# Patient Record
Sex: Male | Born: 1935 | Race: Black or African American | Hispanic: No | Marital: Married | Smoking: Former smoker
Health system: Southern US, Community
[De-identification: ages and names within clinical notes are randomized; demographics above are authoritative.]

## PROBLEM LIST (undated history)

## (undated) DIAGNOSIS — I499 Cardiac arrhythmia, unspecified: Secondary | ICD-10-CM

## (undated) DIAGNOSIS — K219 Gastro-esophageal reflux disease without esophagitis: Secondary | ICD-10-CM

## (undated) HISTORY — DX: Cardiac arrhythmia, unspecified: I49.9

## (undated) HISTORY — DX: Gastro-esophageal reflux disease without esophagitis: K21.9

---

## 2008-12-27 ENCOUNTER — Ambulatory Visit: Payer: Self-pay | Admitting: Ophthalmology

## 2009-03-07 ENCOUNTER — Ambulatory Visit: Payer: Self-pay | Admitting: Ophthalmology

## 2010-03-03 HISTORY — PX: FEMORAL ARTERY ANEURYSM REPAIR: SUR1157

## 2011-12-08 ENCOUNTER — Ambulatory Visit: Payer: Self-pay

## 2013-07-08 DIAGNOSIS — I10 Essential (primary) hypertension: Secondary | ICD-10-CM | POA: Insufficient documentation

## 2013-07-08 DIAGNOSIS — Z86718 Personal history of other venous thrombosis and embolism: Secondary | ICD-10-CM | POA: Insufficient documentation

## 2013-07-08 DIAGNOSIS — E7800 Pure hypercholesterolemia, unspecified: Secondary | ICD-10-CM | POA: Insufficient documentation

## 2013-07-08 DIAGNOSIS — D649 Anemia, unspecified: Secondary | ICD-10-CM | POA: Insufficient documentation

## 2013-07-08 DIAGNOSIS — I82409 Acute embolism and thrombosis of unspecified deep veins of unspecified lower extremity: Secondary | ICD-10-CM | POA: Insufficient documentation

## 2015-04-03 DIAGNOSIS — R4182 Altered mental status, unspecified: Secondary | ICD-10-CM | POA: Insufficient documentation

## 2015-04-03 DIAGNOSIS — D61818 Other pancytopenia: Secondary | ICD-10-CM | POA: Insufficient documentation

## 2015-04-05 DIAGNOSIS — F0390 Unspecified dementia without behavioral disturbance: Secondary | ICD-10-CM | POA: Insufficient documentation

## 2015-04-09 ENCOUNTER — Encounter: Payer: Self-pay | Admitting: Family Medicine

## 2015-04-17 ENCOUNTER — Encounter: Payer: Self-pay | Admitting: Urology

## 2015-04-17 ENCOUNTER — Ambulatory Visit (INDEPENDENT_AMBULATORY_CARE_PROVIDER_SITE_OTHER): Payer: Medicare Other | Admitting: Urology

## 2015-04-17 VITALS — BP 156/96 | HR 56 | Ht 72.0 in | Wt 153.6 lb

## 2015-04-17 DIAGNOSIS — Z125 Encounter for screening for malignant neoplasm of prostate: Secondary | ICD-10-CM | POA: Diagnosis not present

## 2015-04-17 DIAGNOSIS — R3129 Other microscopic hematuria: Secondary | ICD-10-CM | POA: Diagnosis not present

## 2015-04-17 DIAGNOSIS — N189 Chronic kidney disease, unspecified: Secondary | ICD-10-CM

## 2015-04-17 DIAGNOSIS — N2889 Other specified disorders of kidney and ureter: Secondary | ICD-10-CM | POA: Insufficient documentation

## 2015-04-17 DIAGNOSIS — N182 Chronic kidney disease, stage 2 (mild): Secondary | ICD-10-CM | POA: Insufficient documentation

## 2015-04-17 LAB — URINALYSIS, COMPLETE
BILIRUBIN UA: NEGATIVE
GLUCOSE, UA: NEGATIVE
Leukocytes, UA: NEGATIVE
NITRITE UA: NEGATIVE
Specific Gravity, UA: 1.025 (ref 1.005–1.030)
UUROB: 1 mg/dL (ref 0.2–1.0)
pH, UA: 5.5 (ref 5.0–7.5)

## 2015-04-17 LAB — MICROSCOPIC EXAMINATION

## 2015-04-17 NOTE — Progress Notes (Signed)
04/17/2015 1:51 PM   Donald Murphy Jul 06, 1922 [id number]  Referring provider: No referring provider defined for this encounter.  Chief Complaint  Patient presents with  . New Patient (Initial Visit)    microscopic hematuria    HPI:  1 - Microscopic Hematuria - pt with blood on UA >10RBC/hpf x several. Non-smoker. Does have remote h/o TB (lung only per history) treated in 1970s.  2 - Prostate Screening - No FHX prostate cancer. DRE 04/2015 50gm Rt nodular / PSA pending. Undergoing "for cause" eval 2017 given hematuria above.  3 - Mild Renal Insuficiency - Cr 1.6 / GFR 50 x years. NO recent GU imaging.  PMH sig for mild dementia, PAD/plavix. He still lives independently and is remarkably spry for 82yo. His PCP is Dr. Vira [facility].   Today "Donald Murphy" is seen as new patient for above.   PMH: Past Medical History  Diagnosis Date  . Acid reflux   . Arrhythmia     Surgical History: Past Surgical History  Procedure Laterality Date  . Femoral artery aneurysm repair  2012    Home Medications:    Medication List       This list is accurate as of: 04/17/15  1:51 PM.  Always use your most recent med list.               amLODipine 2.5 MG tablet  Commonly known as:  NORVASC  Take by mouth.     clopidogrel 75 MG tablet  Commonly known as:  PLAVIX  Take by mouth.     donepezil 5 MG tablet  Commonly known as:  ARICEPT  Take by mouth.     lisinopril 20 MG tablet  Commonly known as:  PRINIVIL,ZESTRIL  Take by mouth.     MULTI-VITAMINS Tabs  Take by mouth.     pravastatin 40 MG tablet  Commonly known as:  PRAVACHOL  Take by mouth.     RA VITAMIN B-12 TR 1000 MCG Tbcr  Generic drug:  Cyanocobalamin  Take by mouth.        Allergies:  Allergies  Allergen Reactions  . Lisinopril-Hydrochlorothiazide Other (See Comments)    Renal insufficiency     Family History: No family history on file.  Social History:  has no tobacco, alcohol, and drug  history on file.  ROS: UROLOGY Frequent Urination?: Yes Hard to postpone urination?: Yes Burning/pain with urination?: No Get up at night to urinate?: Yes Leakage of urine?: No Urine stream starts and stops?: Yes Trouble starting stream?: No Do you have to strain to urinate?: No Blood in urine?: Yes Urinary tract infection?: No Sexually transmitted disease?: No Injury to kidneys or bladder?: No Painful intercourse?: No Weak stream?: No Erection problems?: Yes Penile pain?: No  Gastrointestinal Nausea?: No Vomiting?: No Indigestion/heartburn?: Yes Diarrhea?: No Constipation?: No  Constitutional Fever: No Night sweats?: No Weight loss?: Yes Fatigue?: Yes  Skin Skin rash/lesions?: Yes Itching?: No  Eyes Blurred vision?: No Double vision?: No  Ears/Nose/Throat Sore throat?: No Sinus problems?: No  Hematologic/Lymphatic Swollen glands?: No Easy bruising?: No  Cardiovascular Leg swelling?: Yes Chest pain?: No  Respiratory Cough?: No Shortness of breath?: Yes  Endocrine Excessive thirst?: No  Musculoskeletal Back pain?: Yes Joint pain?: Yes  Neurological Headaches?: No Dizziness?: No  Psychologic Depression?: No Anxiety?: No  Physical Exam: BP 156/96 mmHg  Pulse 56  Ht 6' (1.829 m)  Wt 153 lb 9.6 oz (69.673 kg)  BMI 20.83 kg/m2  Constitutional:  Alert and oriented, No acute distress. HEENT: [place] AT, moist mucus membranes.  Trachea midline, no masses. Cardiovascular: No clubbing, cyanosis, or edema. Respiratory: Normal respiratory effort, no increased work of breathing. GI: Abdomen is soft, nontender, nondistended, no abdominal masses GU: No CVA tenderness. DRE Rt side nodular. Skin: No rashes, bruises or suspicious lesions. Lymph: No cervical or inguinal adenopathy. Neurologic: Grossly intact, no focal deficits, moving all 4 extremities. Psychiatric: Normal mood and affect.  Laboratory Data: No results found for: WBC, HGB, HCT, MCV,  PLT  No results found for: CREATININE  No results found for: PSA  No results found for: TESTOSTERONE  No results found for: HGBA1C  Urinalysis No results found for: COLORURINE, APPEARANCEUR, LABSPEC, PHURINE, GLUCOSEU, HGBUR, BILIRUBINUR, KETONESUR, PROTEINUR, UROBILINOGEN, NITRITE, LEUKOCYTESUR  Pertinent Imaging: none  Assessment & Plan:    1 - Microscopic Hematuria - We discussed the potential causes of hematuria ranging from benign etiologies (medical renal disease, urolithiasis, AV malformation, other GU malformation) to malignancy (cancers of the kidney, ureter, bladder, prostate in men). We discussed that current use of blood thinners may exacerbate or unmask the condition. We then outlined the well-accepted guideline-advocated workup including labs, axial imaging and cystoscopy. We mentioned that in up to 30% of cases no identifiable etiology is found, in which case we advocate yearly urinalysis + repeat evaluation for new episodes of gross blood.  The patient wishes to proceed with work-up.   BMP  2 - Prostate Screening - PSA today. HIs exam is concerning.   3 - Mild Renal Insuficiency - Imaging on return to verify no hydro / obsstruction  4 - RTC 3-4 weeks for CT and cysto  CC:  Dr. [facility].   No Follow-up on file.  Sebastian Ache, MD  [facility] [address] [place], [place] [address] [phone]

## 2015-04-18 LAB — BASIC METABOLIC PANEL
BUN / CREAT RATIO: 10 (ref 10–22)
BUN: 12 mg/dL (ref 10–36)
CO2: 19 mmol/L (ref 18–29)
CREATININE: 1.24 mg/dL (ref 0.76–1.27)
Calcium: 9.7 mg/dL (ref 8.6–10.2)
Chloride: 103 mmol/L (ref 96–106)
GFR, EST AFRICAN AMERICAN: 59 mL/min/{1.73_m2} — AB (ref >59)
GFR, EST NON AFRICAN AMERICAN: 51 mL/min/{1.73_m2} — AB (ref >59)
GLUCOSE: 92 mg/dL (ref 65–99)
Potassium: 4.8 mmol/L (ref 3.5–5.2)
SODIUM: 140 mmol/L (ref 134–144)

## 2015-04-18 LAB — PSA: Prostate Specific Ag, Serum: 4.6 ng/mL — ABNORMAL HIGH (ref 0.0–4.0)

## 2015-05-07 ENCOUNTER — Ambulatory Visit: Admission: RE | Admit: 2015-05-07 | Payer: Medicare Other | Source: Ambulatory Visit

## 2015-05-10 ENCOUNTER — Other Ambulatory Visit: Payer: Medicare Other | Admitting: Urology

## 2015-05-10 ENCOUNTER — Encounter: Payer: Self-pay | Admitting: Urology

## 2015-06-01 ENCOUNTER — Ambulatory Visit
Admission: RE | Admit: 2015-06-01 | Discharge: 2015-06-01 | Disposition: A | Discharge status: Home / Self Care | Payer: Medicare Other | Source: Ambulatory Visit | Attending: Urology | Admitting: Urology

## 2015-06-01 DIAGNOSIS — N182 Chronic kidney disease, stage 2 (mild): Secondary | ICD-10-CM

## 2015-06-01 DIAGNOSIS — R3129 Other microscopic hematuria: Secondary | ICD-10-CM | POA: Insufficient documentation

## 2015-06-01 DIAGNOSIS — N189 Chronic kidney disease, unspecified: Secondary | ICD-10-CM | POA: Diagnosis present

## 2015-06-01 LAB — POCT I-STAT CREATININE: Creatinine, Ser: 1.4 mg/dL — ABNORMAL HIGH (ref 0.61–1.24)

## 2015-06-01 MED ORDER — IOPAMIDOL (ISOVUE-300) INJECTION 61%
100.0000 mL | Freq: Once | INTRAVENOUS | Status: AC | PRN
  Administered 2015-06-01: 100 mL via INTRAVENOUS

## 2015-06-05 ENCOUNTER — Ambulatory Visit (INDEPENDENT_AMBULATORY_CARE_PROVIDER_SITE_OTHER): Payer: Medicare Other | Admitting: Urology

## 2015-06-05 VITALS — BP 149/78 | HR 60 | Ht 72.0 in | Wt 146.0 lb

## 2015-06-05 DIAGNOSIS — R3129 Other microscopic hematuria: Secondary | ICD-10-CM | POA: Diagnosis not present

## 2015-06-05 DIAGNOSIS — Q6100 Congenital renal cyst, unspecified: Secondary | ICD-10-CM

## 2015-06-05 DIAGNOSIS — N182 Chronic kidney disease, stage 2 (mild): Secondary | ICD-10-CM

## 2015-06-05 DIAGNOSIS — N189 Chronic kidney disease, unspecified: Secondary | ICD-10-CM | POA: Diagnosis not present

## 2015-06-05 DIAGNOSIS — Z125 Encounter for screening for malignant neoplasm of prostate: Secondary | ICD-10-CM

## 2015-06-05 DIAGNOSIS — N281 Cyst of kidney, acquired: Secondary | ICD-10-CM

## 2015-06-05 LAB — URINALYSIS, COMPLETE
BILIRUBIN UA: NEGATIVE
Glucose, UA: NEGATIVE
Ketones, UA: NEGATIVE
LEUKOCYTES UA: NEGATIVE
NITRITE UA: NEGATIVE
PH UA: 6 (ref 5.0–7.5)
Protein, UA: NEGATIVE
RBC UA: NEGATIVE
Specific Gravity, UA: 1.015 (ref 1.005–1.030)
Urobilinogen, Ur: 0.2 mg/dL (ref 0.2–1.0)

## 2015-06-05 LAB — MICROSCOPIC EXAMINATION
BACTERIA UA: NONE SEEN
EPITHELIAL CELLS (NON RENAL): NONE SEEN /HPF (ref 0–10)
WBC UA: NONE SEEN /HPF (ref 0–?)

## 2015-06-05 MED ORDER — LIDOCAINE HCL 2 % EX GEL
1.0000 "application " | Freq: Once | CUTANEOUS | Status: AC
  Administered 2015-06-05: 1 via URETHRAL

## 2015-06-05 MED ORDER — CIPROFLOXACIN HCL 500 MG PO TABS
500.0000 mg | ORAL_TABLET | Freq: Once | ORAL | Status: AC
  Administered 2015-06-05: 500 mg via ORAL

## 2015-06-05 NOTE — Progress Notes (Signed)
10:01 AM   Donald Murphy 29-Aug-1922 [id number]  Referring provider: Sula Rumpleharanjit Virk, MD [address] [place], [place] [id number]  Chief Complaint  Patient presents with  . Cysto    CTscan results    HPI:  1 - Microscopic Hematuria - pt with blood on UA >10RBC/hpf x several. Non-smoker. Does have remote h/o TB (lung only per history) treated in 1970s. CT urogram 05/2015 w/o worrisome masses/ stones. Cysto 06/2015 with some assymetric prostattic hypertrophy but now urothelial lesions.   2 - Prostate Screening - No FHX prostate cancer. DRE 04/2015 50gm PSA 4.6 at age 82 (normal age adjusted) Undergoing "for cause" eval 2017 given hematuria above.  3 - Mild Renal Insuficiency - Cr 1.6 / GFR 50 x years. CT 2017 w/o hydro.   4 - Bilateral Non-Complex Renal Cysts - incidental cysts w/o enhancement / nodules by hematuria CT 2017. Largest lef upper 5.5 cm but no mass effect.   PMH sig for mild dementia, PAD/plavix. He still lives independently and is remarkably spry for 82yo. His PCP is Dr. Vira [facility]anco. [facility]   Today "Donald MaoWillard" is seen for cysto and f/u above.   PMH: Past Medical History  Diagnosis Date  . Acid reflux   . Arrhythmia     Surgical History: Past Surgical History  Procedure Laterality Date  . Femoral artery aneurysm repair  2012    Home Medications:    Medication List       This list is accurate as of: 06/05/15 10:01 AM.  Always use your most recent med list.               amLODipine 2.5 MG tablet  Commonly known as:  NORVASC  Take by mouth.     clopidogrel 75 MG tablet  Commonly known as:  PLAVIX  Take by mouth.     donepezil 5 MG tablet  Commonly known as:  ARICEPT  Take by mouth.     lisinopril 20 MG tablet  Commonly known as:  PRINIVIL,ZESTRIL  Take by mouth.     MULTI-VITAMINS Tabs  Take by mouth.     pravastatin 40 MG tablet  Commonly known as:  PRAVACHOL  Take by mouth.     RA VITAMIN B-12 TR 1000 MCG Tbcr  Generic drug:   Cyanocobalamin  Take by mouth.        Allergies:  Allergies  Allergen Reactions  . Lisinopril-Hydrochlorothiazide Other (See Comments)    Renal insufficiency     Family History: No family history on file.  Social History:  reports that he quit smoking about 40 years ago. His smoking use included Cigars. He does not have any smokeless tobacco history on file. He reports that he does not drink alcohol or use illicit drugs.  ROS: No new HEENT, GI, CV, GU, Endocrine, Lymphatic compliants   Physical Exam: BP 149/78 mmHg  Pulse 60  Ht 6' (1.829 m)  Wt 146 lb (66.225 kg)  BMI 19.80 kg/m2  Constitutional:  Alert and oriented, No acute distress. HEENT: [place] AT, moist mucus membranes.  Trachea midline, no masses. Cardiovascular: No clubbing, cyanosis, or edema. Respiratory: Normal respiratory effort, no increased work of breathing. GI: Abdomen is soft, nontender, nondistended, no abdominal masses GU: No CVA tenderness. DRE Rt side nodular. Skin: No rashes, bruises or suspicious lesions. Lymph: No cervical or inguinal adenopathy. Neurologic: Grossly intact, no focal deficits, moving all 4 extremities. Psychiatric: Normal mood and affect.  Laboratory Data: No results found  for: WBC, HGB, HCT, MCV, PLT  Lab Results  Component Value Date   CREATININE 1.40* 06/01/2015    No results found for: PSA  No results found for: TESTOSTERONE  No results found for: HGBA1C  Urinalysis    Component Value Date/Time   APPEARANCEUR Clear 04/17/2015 1354   GLUCOSEU Negative 04/17/2015 1354   BILIRUBINUR Negative 04/17/2015 1354   PROTEINUR 1+* 04/17/2015 1354   NITRITE Negative 04/17/2015 1354   LEUKOCYTESUR Negative 04/17/2015 1354       Cystoscopy Procedure Note  Patient identification was confirmed, informed consent was obtained, and patient was prepped using Betadine solution.  Lidocaine jelly was administered per urethral meatus.    Preoperative abx where received prior to  procedure.     Pre-Procedure: - Inspection reveals a normal caliber ureteral meatus.  Procedure: The flexible cystoscope was introduced without difficulty - No urethral strictures/lesions are present. - Enlarged prostate with Lt>Rt lobe ypertrophy.  - Normal bladder neck - Bilateral ureteral orifices identified - Bladder mucosa  reveals no ulcers, tumors, or lesions - No bladder stones - Mild trabeculation  Retroflexion shows no additional findings.    Post-Procedure: - Patient tolerated the procedure well    Pertinent Imaging: none  Assessment & Plan:    1 - Microscopic Hematuria - eval with labs, exam, imaging, cysto with likely BPH in setting of plavix as likely cause. Would consider daily finasteride if gross hematuria of bother from obsctruive voiding develops.   2 - Prostate Screening - PSA normgal for age and no severe intraluminal obsruction by cysto. No role for further screening.    3 - Mild Renal Insuficiency - likely medical renal disease, no hydro on imaging.  4 - Bilateral Non-Complex Renal Cysts - <1% chance malignancy, and no mass effect. No further evaluation or surveillance warranted.  5 - RTC 1 year with NP to verify no interval gross hematuria, then prn.    CC:  [facility]. [facility].   No Follow-up on file.  Sebastian Ache, MD  [facility] [address], [address] [place], [place] [address] [phone]

## 2016-06-04 ENCOUNTER — Ambulatory Visit: Payer: Medicare Other

## 2016-06-23 ENCOUNTER — Encounter: Payer: Self-pay | Admitting: Urology

## 2016-06-23 ENCOUNTER — Ambulatory Visit (INDEPENDENT_AMBULATORY_CARE_PROVIDER_SITE_OTHER): Payer: Medicare Other | Admitting: Urology

## 2016-06-23 VITALS — BP 105/72 | HR 112 | Ht 72.0 in | Wt 150.0 lb

## 2016-06-23 DIAGNOSIS — R3129 Other microscopic hematuria: Secondary | ICD-10-CM | POA: Diagnosis not present

## 2016-06-23 NOTE — Progress Notes (Signed)
06/23/2016 9:31 AM   Donald Murphy 13-Jan-1923 [id number]  Referring provider: Sula Rumple, MD [address] [place], [place] [address]  Chief Complaint  Patient presents with  . Hematuria    HPI: 1 - Microscopic Hematuria - pt with blood on UA >10RBC/hpf x several. Non-smoker. Does have remote h/o TB (lung only per history) treated in 1970s. CT urogram 05/2015 w/o worrisome masses/ stones. Cysto 06/2015 with some assymetric prostattic hypertrophy but no urothelial lesions (normal).   2 - Prostate Screening - No FHX prostate cancer. DRE 04/2015 50gm PSA 4.6 at age 30 (normal age adjusted) Undergoing "for cause" eval 2017 given hematuria above.  3 - Mild Renal Insuficiency - Cr 1.6 / GFR 50 x years. CT 2017 w/o hydro. Jan 2018 bun 24 and Cr 1.6.   4 - Bilateral Non-Complex Renal Cysts - incidental cysts w/o enhancement / nodules by hematuria CT 2017. Largest lef upper 5.5 cm but no mass effect.   PMH sig for mild dementia, PAD/plavix. He still lives independently and is remarkably spry for 83yo. His PCP is Dr. [facility]a [facility].     Today, Donald Murphy is seen for the above. He has been well. No LUTS. No gross hematuria. No flank pain or stone passage. He cannot leave a specimen today. He said he voided just before they called him back.     PMH: Past Medical History:  Diagnosis Date  . Acid reflux   . Arrhythmia     Surgical History: Past Surgical History:  Procedure Laterality Date  . FEMORAL ARTERY ANEURYSM REPAIR  2012    Home Medications:  Allergies as of 06/23/2016      Reactions   Lisinopril-hydrochlorothiazide Other (See Comments)   Renal insufficiency       Medication List       Accurate as of 06/23/16  9:31 AM. Always use your most recent med list.          amLODipine 2.5 MG tablet Commonly known as:  NORVASC Take by mouth.   clopidogrel 75 MG tablet Commonly known as:  PLAVIX Take by mouth.   donepezil 5 MG tablet Commonly known as:   ARICEPT Take by mouth.   lisinopril 20 MG tablet Commonly known as:  PRINIVIL,ZESTRIL Take by mouth.   multivitamin tablet Take 1 tablet by mouth daily.   pravastatin 40 MG tablet Commonly known as:  PRAVACHOL Take by mouth.   VITAMIN B 12 PO Take by mouth.       Allergies:  Allergies  Allergen Reactions  . Lisinopril-Hydrochlorothiazide Other (See Comments)    Renal insufficiency     Family History: Family History  Problem Relation Age of Onset  . Prostate cancer Neg Hx   . Bladder Cancer Neg Hx   . Kidney cancer Neg Hx     Social History:  reports that he quit smoking about 41 years ago. His smoking use included Cigars. He has never used smokeless tobacco. He reports that he does not drink alcohol or use drugs.  ROS: UROLOGY Frequent Urination?: No Hard to postpone urination?: No Burning/pain with urination?: No Get up at night to urinate?: No Leakage of urine?: No Urine stream starts and stops?: No Trouble starting stream?: No Do you have to strain to urinate?: No Blood in urine?: No Urinary tract infection?: No Sexually transmitted disease?: No Injury to kidneys or bladder?: No Painful intercourse?: No Weak stream?: No Erection problems?: No Penile pain?: No  Gastrointestinal Nausea?: No Vomiting?:  No Indigestion/heartburn?: No Diarrhea?: No Constipation?: No  Constitutional Fever: No Night sweats?: No Weight loss?: No Fatigue?: No  Skin Skin rash/lesions?: No Itching?: No  Eyes Blurred vision?: No Double vision?: No  Ears/Nose/Throat Sore throat?: No Sinus problems?: No  Hematologic/Lymphatic Swollen glands?: No Easy bruising?: No  Cardiovascular Leg swelling?: No Chest pain?: No  Respiratory Cough?: No Shortness of breath?: No  Endocrine Excessive thirst?: No  Musculoskeletal Back pain?: No Joint pain?: No  Neurological Headaches?: No Dizziness?: No  Psychologic Depression?: No Anxiety?: No  Physical  Exam: BP 105/72 (BP Location: Left Arm, Patient Position: Sitting, Cuff Size: Normal)   Pulse (!) 112   Ht 6' (1.829 m)   Wt 68 kg (150 lb)   BMI 20.34 kg/m   Constitutional:  Alert and oriented, No acute distress. HEENT: [place] AT, moist mucus membranes.  Trachea midline, no masses. Cardiovascular: No clubbing, cyanosis, or edema. Respiratory: Normal respiratory effort, no increased work of breathing. GI: Abdomen is soft, nontender, nondistended, no abdominal masses GU: No CVA tenderness.  Penis - foreskin normal, uncircumcised, penis normal  Scrotum - normal  DRE - prostate 30-40 g, smooth, all landmarks preserved Skin: No rashes, bruises or suspicious lesions. Lymph: No cervical or inguinal adenopathy. Neurologic: Grossly intact, no focal deficits, moving all 4 extremities. Psychiatric: Normal mood and affect.  Laboratory Data: No results found for: WBC, HGB, HCT, MCV, PLT  Lab Results  Component Value Date   CREATININE 1.40 (H) 06/01/2015    No results found for: PSA  No results found for: TESTOSTERONE  No results found for: HGBA1C  Urinalysis    Component Value Date/Time   APPEARANCEUR Clear 06/05/2015 0940   GLUCOSEU Negative 06/05/2015 0940   BILIRUBINUR Negative 06/05/2015 0940   PROTEINUR Negative 06/05/2015 0940   NITRITE Negative 06/05/2015 0940   LEUKOCYTESUR Negative 06/05/2015 0940    Pertinent Imaging: CT   Assessment & Plan:    1. Microscopic hematuria - no worrisome symptoms. Normal exam today. Monitor for gross hematuria, flank pain, urinary symptoms, etc. Check in 1 year and then PRN if stable.       No Follow-up on file.  Jerilee Field, MD  [facility] [address] [place], [place] [address] [phone]

## 2016-09-05 IMAGING — CT CT ABD-PEL WO/W CM
2 of 4 series · 13 of 32 positions shown, 18 images · IV contrast (iopamidol)
Comparison: None.

CLINICAL DATA: Microscopic hematuria, end-stage renal disease, AAA
repair

EXAM:
CT ABDOMEN AND PELVIS WITHOUT AND WITH CONTRAST
TECHNIQUE: Multidetector CT imaging of the abdomen and pelvis was performed
following the standard protocol before and following the bolus
administration of intravenous contrast.
CONTRAST:  100mL EMHSK6-M88 IOPAMIDOL (EMHSK6-M88) INJECTION 61%

[Series 2: hematuria > 45 wo · axial · 0.67mm/px · z∈[-1138,-813]mm · 8 of 85 slices shown, 13 images]
[im 10/85  soft-tissue]
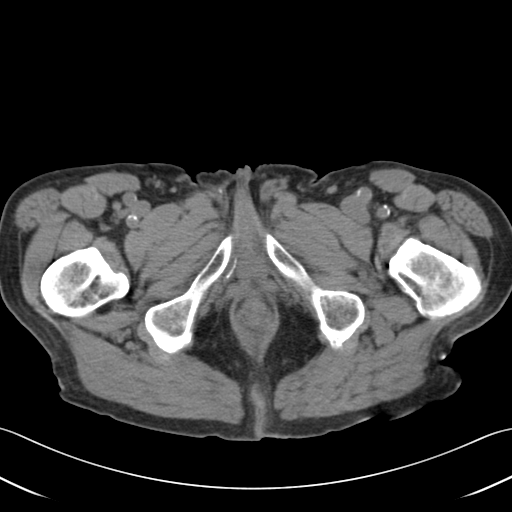
[im 10/85  bone]
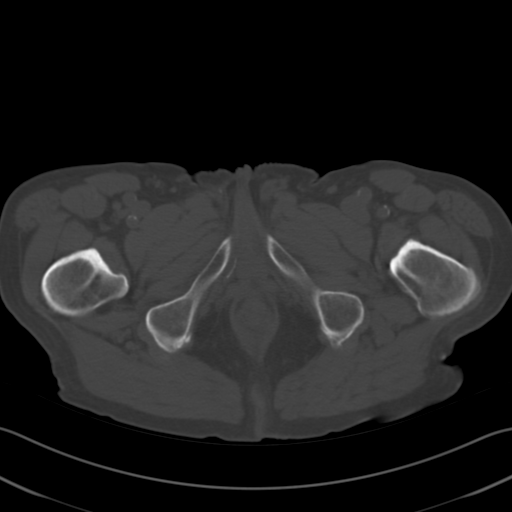
[im 19/85  soft-tissue]
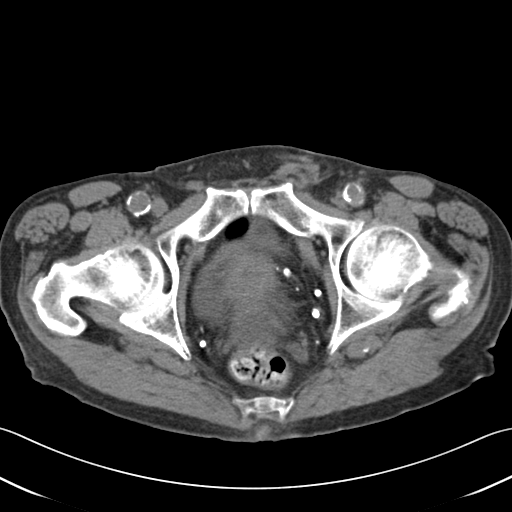
[im 29/85  soft-tissue]
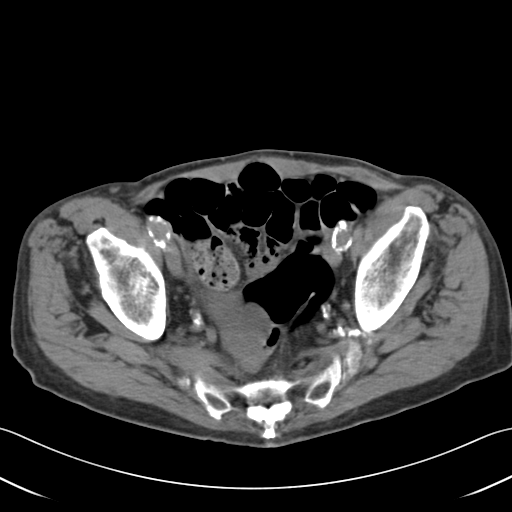
[im 38/85  soft-tissue]
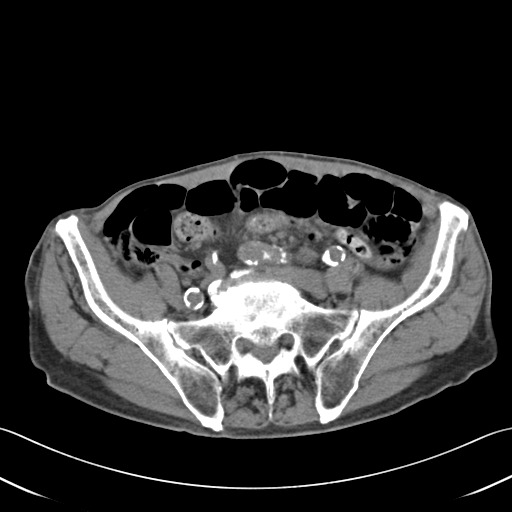
[im 47/85  soft-tissue]
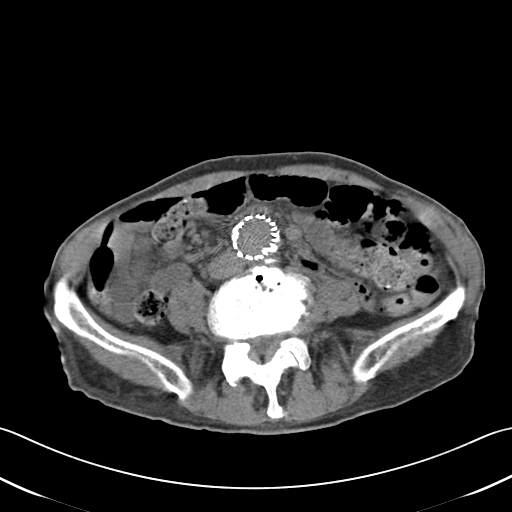
[im 47/85  lung]
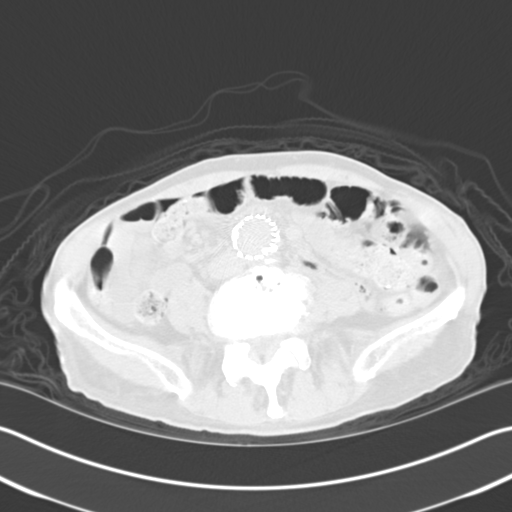
[im 57/85  soft-tissue]
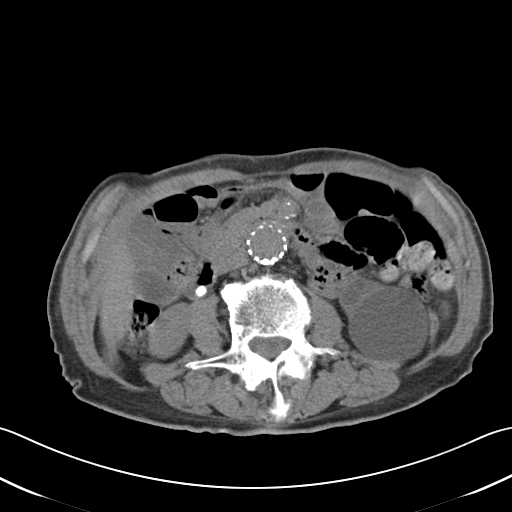
[im 57/85  lung]
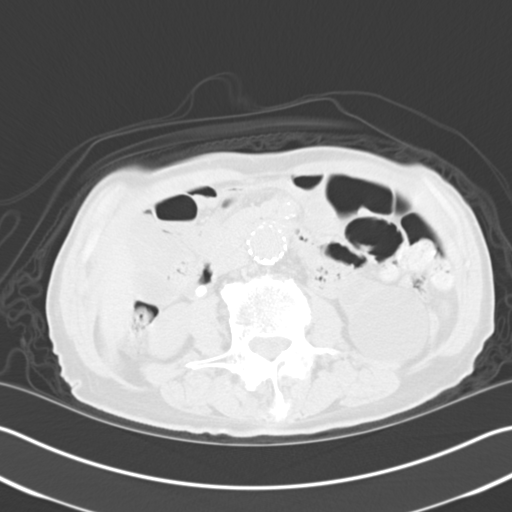
[im 66/85  soft-tissue]
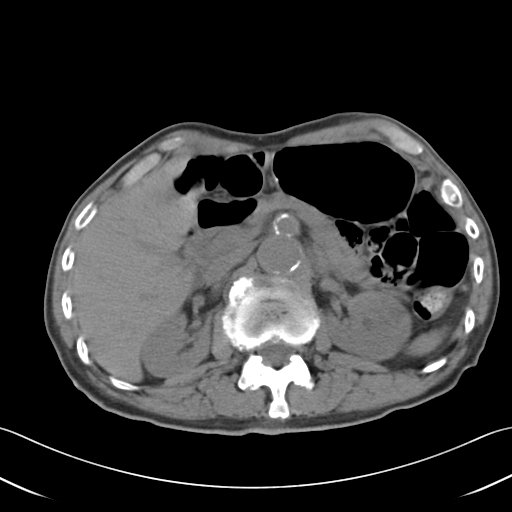
[im 66/85  lung]
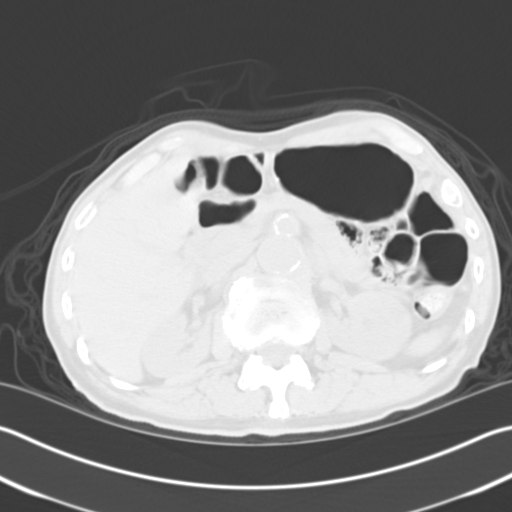
[im 75/85  soft-tissue]
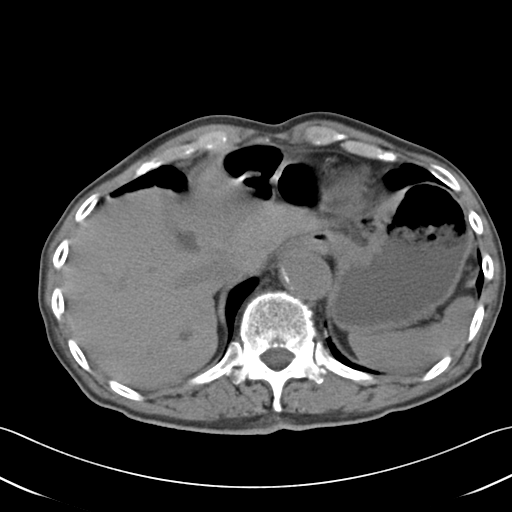
[im 75/85  lung]
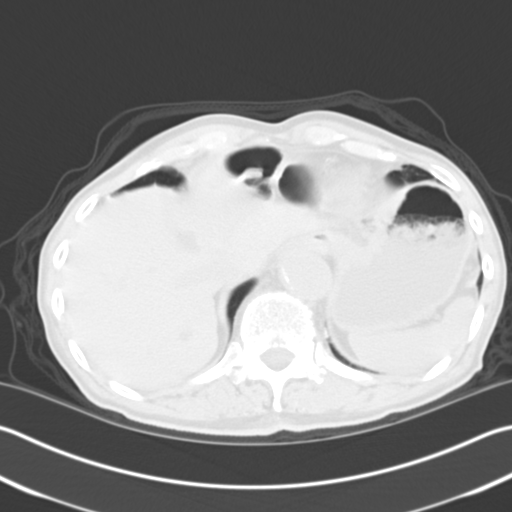

[Series 12: hematuria > 45 delay · axial · delayed · 0.68mm/px · z∈[-1138,-953]mm · 5 of 85 slices shown]
[im 10/85  soft-tissue]
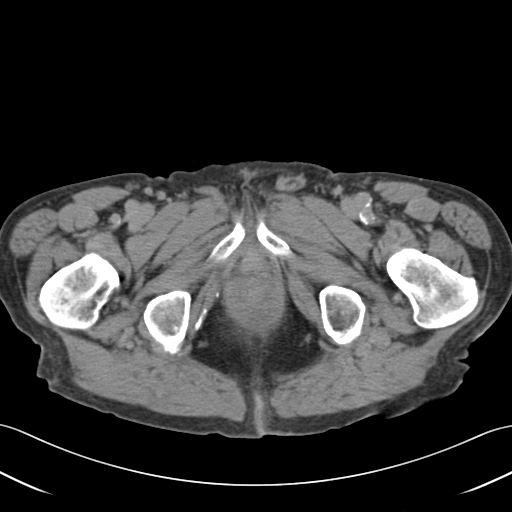
[im 19/85  soft-tissue]
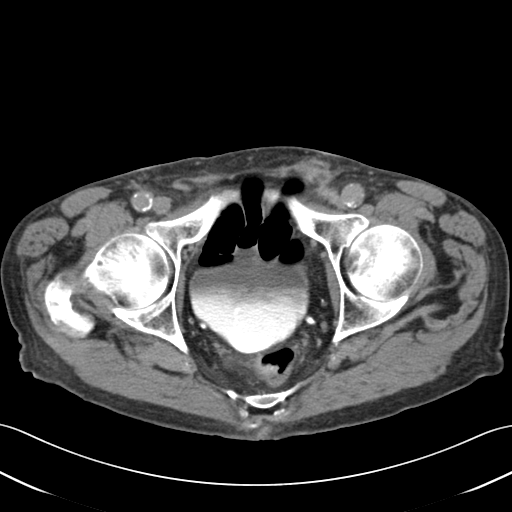
[im 29/85  soft-tissue]
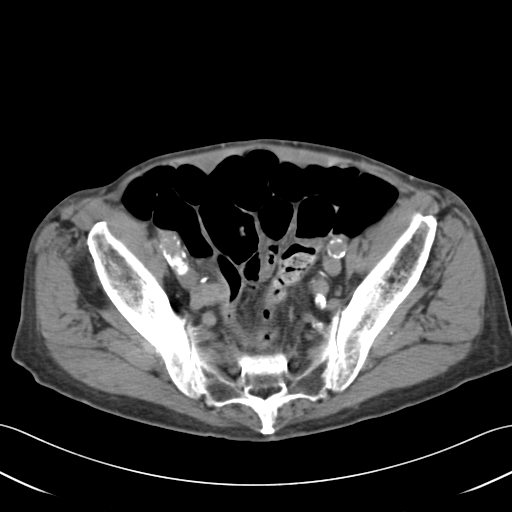
[im 38/85  soft-tissue]
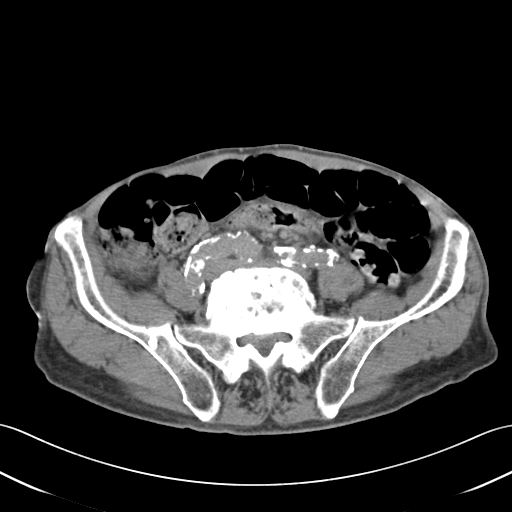
[im 47/85  soft-tissue]
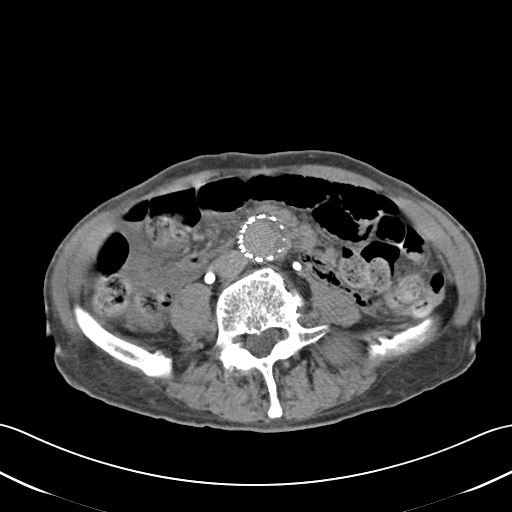

[13 of 32 positions shown; findings below may reference images not displayed]

FINDINGS: Lower chest: Mild patchy opacity with high density at the right lung
base (series 4/ image 13), likely atelectasis/ scarring, possibly
related to prior aspiration.

Hepatobiliary: Scattered hepatic cysts measuring up to 2.0 cm
(series 17/ image 14).

Gallbladder is poorly evaluated but grossly unremarkable. No
intrahepatic or extrahepatic ductal dilatation.

Pancreas: Parenchymal atrophy. Mild ductal prominence, without
associated mass.

Spleen: Within normal limits.

Adrenals/Urinary Tract: Adrenal glands are within normal limits.

Right kidney is grossly unremarkable.

Multiple left renal cysts, measuring up to 5.5 cm in the lateral
left lower pole (series 17/ image 31). Mildly complex/septated
cm medial left lower pole renal cyst (series 17/ image 33), without
enhancement, benign (Bosniak II).

Left renal vascular calcification (series 2/ image 22). No renal,
ureteral, or bladder calculi. No hydronephrosis.

Bladder is within normal limits.

Stomach/Bowel: Stomach is within normal limits.

Visualized bowel is unremarkable.

Normal appendix (series 17/ image 53).

Vascular/Lymphatic: Atherosclerotic calcifications the abdominal
aorta and branch vessels. AAA stent.

No suspicious abdominopelvic lymphadenopathy.

Reproductive: Prostate is grossly unremarkable.

Other: Small volume abdominopelvic ascites.

Postsurgical changes related to left inguinal hernia repair.

Musculoskeletal: Degenerative changes of the visualized
thoracolumbar spine.
IMPRESSION: Multiple left renal cysts, measuring up to 5.5 cm, benign (Bosniak
I-II). No enhancing renal lesions.

No renal, ureteral, or bladder calculi.  No hydronephrosis.

Additional ancillary findings as above.

## 2016-10-13 DIAGNOSIS — I714 Abdominal aortic aneurysm, without rupture, unspecified: Secondary | ICD-10-CM | POA: Insufficient documentation

## 2016-10-17 DIAGNOSIS — D696 Thrombocytopenia, unspecified: Secondary | ICD-10-CM | POA: Insufficient documentation

## 2017-06-23 ENCOUNTER — Ambulatory Visit: Payer: Medicare Other | Admitting: Urology

## 2017-07-09 ENCOUNTER — Other Ambulatory Visit: Payer: Self-pay

## 2017-07-09 DIAGNOSIS — R3129 Other microscopic hematuria: Secondary | ICD-10-CM

## 2017-07-10 ENCOUNTER — Ambulatory Visit (INDEPENDENT_AMBULATORY_CARE_PROVIDER_SITE_OTHER): Payer: Medicare Other | Admitting: Urology

## 2017-07-10 ENCOUNTER — Ambulatory Visit: Payer: Medicare Other | Admitting: Urology

## 2017-07-10 ENCOUNTER — Encounter: Payer: Self-pay | Admitting: Urology

## 2017-07-10 ENCOUNTER — Other Ambulatory Visit
Admission: RE | Admit: 2017-07-10 | Discharge: 2017-07-10 | Disposition: A | Discharge status: Home / Self Care | Payer: Medicare Other | Source: Ambulatory Visit | Attending: Urology | Admitting: Urology

## 2017-07-10 VITALS — BP 115/67 | HR 105

## 2017-07-10 DIAGNOSIS — R3129 Other microscopic hematuria: Secondary | ICD-10-CM

## 2017-07-10 LAB — URINALYSIS, COMPLETE (UACMP) WITH MICROSCOPIC
Bacteria, UA: NONE SEEN
Bilirubin Urine: NEGATIVE
Glucose, UA: NEGATIVE mg/dL
Hgb urine dipstick: NEGATIVE
Ketones, ur: NEGATIVE mg/dL
Leukocytes, UA: NEGATIVE
Nitrite: NEGATIVE
Protein, ur: NEGATIVE mg/dL
Specific Gravity, Urine: 1.015 (ref 1.005–1.030)
pH: 6 (ref 5.0–8.0)

## 2017-07-10 NOTE — Progress Notes (Signed)
07/10/2017 2:39 PM   Renie Ora Aug 08, 1922 [id number]  Referring provider: Sula Rumple, MD No address on file  Chief Complaint  Patient presents with  . Hematuria    1year    HPI: 84 year old male who presents today for annual follow-up.  He has a personal history of microscopic hematuria and underwent sensitive work-up including CT urogram and cystoscopy in 2017 which showed prostatic asymmetry without any other underlying GU pathology.  PSA at the time was appropriate for his age, 4.6.  He also has incidental bilateral simple renal cyst measuring up to 5.5 cm on the left.   Urinalysis today is negative for any blood.  Today, he denies any urinary symptoms including no dysuria or gross hematuria urgency hesitancy or sensation of a complete bladder emptying.  He is pleased with his voiding symptoms.   PMH: Past Medical History:  Diagnosis Date  . Acid reflux   . Arrhythmia     Surgical History: Past Surgical History:  Procedure Laterality Date  . FEMORAL ARTERY ANEURYSM REPAIR  2012    Home Medications:  Allergies as of 07/10/2017      Reactions   Lisinopril-hydrochlorothiazide Other (See Comments)   Renal insufficiency       Medication List        Accurate as of 07/10/17  2:39 PM. Always use your most recent med list.          amLODipine 2.5 MG tablet Commonly known as:  NORVASC Take by mouth.   aspirin 81 MG tablet Take by mouth.   clopidogrel 75 MG tablet Commonly known as:  PLAVIX Take by mouth.   donepezil 5 MG tablet Commonly known as:  ARICEPT Take by mouth.   lisinopril 20 MG tablet Commonly known as:  PRINIVIL,ZESTRIL Take by mouth.   multivitamin tablet Take 1 tablet by mouth daily.   pravastatin 40 MG tablet Commonly known as:  PRAVACHOL Take by mouth.   VITAMIN B 12 PO Take by mouth.       Allergies:  Allergies  Allergen Reactions  . Lisinopril-Hydrochlorothiazide Other (See Comments)    Renal insufficiency      Family History: Family History  Problem Relation Age of Onset  . Prostate cancer Neg Hx   . Bladder Cancer Neg Hx   . Kidney cancer Neg Hx     Social History:  reports that he quit smoking about 42 years ago. His smoking use included cigars. He has never used smokeless tobacco. He reports that he does not drink alcohol or use drugs.  ROS: UROLOGY Frequent Urination?: No Hard to postpone urination?: No Burning/pain with urination?: No Get up at night to urinate?: No Leakage of urine?: No Urine stream starts and stops?: No Trouble starting stream?: No Do you have to strain to urinate?: No Blood in urine?: No Urinary tract infection?: No Sexually transmitted disease?: No Injury to kidneys or bladder?: No Painful intercourse?: No Weak stream?: No Erection problems?: No Penile pain?: No  Gastrointestinal Nausea?: No Vomiting?: No Indigestion/heartburn?: No Diarrhea?: No Constipation?: No  Constitutional Fever: No Night sweats?: No Weight loss?: No Fatigue?: No  Skin Skin rash/lesions?: No Itching?: No  Eyes Blurred vision?: No Double vision?: No  Ears/Nose/Throat Sore throat?: No Sinus problems?: No  Hematologic/Lymphatic Swollen glands?: No Easy bruising?: No  Cardiovascular Leg swelling?: No Chest pain?: No  Respiratory Cough?: No Shortness of breath?: No  Endocrine Excessive thirst?: No  Musculoskeletal Back pain?: No Joint pain?: No  Neurological Headaches?: No  Dizziness?: No  Psychologic Depression?: No Anxiety?: No  Physical Exam: BP 115/67   Pulse (!) 105   Constitutional:  Alert and oriented, No acute distress.  In wheelchair, accompanied by daughter. HEENT: [place] AT, moist mucus membranes.  Trachea midline, no masses. Cardiovascular: No clubbing, cyanosis, or edema. Respiratory: Normal respiratory effort, no increased work of breathing. Skin: No rashes, bruises or suspicious lesions. Neurologic: Grossly intact, no focal  deficits, moving all 4 extremities. Psychiatric: Normal mood and affect.  Laboratory Data: Cr 2.0 on 05/2017  Urinalysis Component     Latest Ref Rng & Units 07/10/2017  Color, Urine     YELLOW YELLOW  Appearance     CLEAR CLEAR  Specific Gravity, Urine     1.005 - 1.030 1.015  pH     5.0 - 8.0 6.0  Glucose     NEGATIVE mg/dL NEGATIVE  Hgb urine dipstick     NEGATIVE NEGATIVE  Bilirubin Urine     NEGATIVE NEGATIVE  Ketones, ur     NEGATIVE mg/dL NEGATIVE  Protein     NEGATIVE mg/dL NEGATIVE  Nitrite     NEGATIVE NEGATIVE  Leukocytes, UA     NEGATIVE NEGATIVE  Squamous Epithelial / LPF     0 - 5 0-5  WBC, UA     0 - 5 WBC/hpf 0-5  RBC / HPF     0 - 5 RBC/hpf 0-5  Bacteria, UA     NONE SEEN NONE SEEN    Pertinent Imaging: No new interval imaging  Assessment & Plan:    1. Microscopic hematuria No evidence of ongoing microscopic hematuria Status post negative evaluation in 2018 Given no recurrence, he may follow-up on as-needed basis  Vanna Scotland, MD  [facility] [address] [place], [place] [address] [phone]

## 2017-12-29 DIAGNOSIS — Z79899 Other long term (current) drug therapy: Secondary | ICD-10-CM | POA: Insufficient documentation

## 2017-12-29 DIAGNOSIS — I739 Peripheral vascular disease, unspecified: Secondary | ICD-10-CM | POA: Insufficient documentation

## 2017-12-30 DIAGNOSIS — I4811 Longstanding persistent atrial fibrillation: Secondary | ICD-10-CM | POA: Insufficient documentation

## 2018-08-31 DIAGNOSIS — R634 Abnormal weight loss: Secondary | ICD-10-CM | POA: Insufficient documentation

## 2018-09-14 DIAGNOSIS — R972 Elevated prostate specific antigen [PSA]: Secondary | ICD-10-CM | POA: Insufficient documentation

## 2018-09-18 DIAGNOSIS — M86271 Subacute osteomyelitis, right ankle and foot: Secondary | ICD-10-CM | POA: Insufficient documentation

## 2018-09-24 DIAGNOSIS — E43 Unspecified severe protein-calorie malnutrition: Secondary | ICD-10-CM | POA: Insufficient documentation

## 2018-09-28 ENCOUNTER — Other Ambulatory Visit: Payer: Self-pay

## 2018-09-28 DIAGNOSIS — N138 Other obstructive and reflux uropathy: Secondary | ICD-10-CM

## 2018-09-28 DIAGNOSIS — N401 Enlarged prostate with lower urinary tract symptoms: Secondary | ICD-10-CM

## 2018-09-28 DIAGNOSIS — Z125 Encounter for screening for malignant neoplasm of prostate: Secondary | ICD-10-CM

## 2018-09-28 DIAGNOSIS — R3129 Other microscopic hematuria: Secondary | ICD-10-CM

## 2018-10-04 ENCOUNTER — Ambulatory Visit: Payer: Medicare Other | Admitting: Urology

## 2018-10-11 DIAGNOSIS — M86671 Other chronic osteomyelitis, right ankle and foot: Secondary | ICD-10-CM | POA: Insufficient documentation

## 2018-10-24 NOTE — Progress Notes (Signed)
10/25/2018 3:58 PM   Donald Murphy Jan 07, 1923 [id number]  Referring provider: Cheryll DessertGeyer, Katherine, FNP No address on file  Chief Complaint  Patient presents with  . Elevated PSA    HPI: 85 year old male with a history of hematuria who presents today for a referral for an elevated PSA by Lorenso QuarryShannon Leach, NP.  History of hematuria (high risk) Former smoker.  CTU in 05/2015 revealed prostatic asymmetry without any other underlying GU pathology.  He also has incidental bilateral simple renal cyst measuring up to 5.5 cm on the left.  Cystoscopy in 06/2015 was NED.  No reports of gross hematuria.  He was found to have a PSA of 7.37 in 08/2018.    He has no urinary complaints at this visit.  Patient denies any gross hematuria, dysuria or suprapubic/flank pain.  Patient denies any fevers, chills, nausea or vomiting.   He has a good appetite and denies any pain.       PMH: Past Medical History:  Diagnosis Date  . Acid reflux   . Arrhythmia     Surgical History: Past Surgical History:  Procedure Laterality Date  . FEMORAL ARTERY ANEURYSM REPAIR  2012    Home Medications:  Allergies as of 10/25/2018      Reactions   Aspirin    Other reaction(s): Other (See Comments) Risk exceeds benefit while on Eliquis   Lisinopril-hydrochlorothiazide Other (See Comments)   Renal insufficiency       Medication List       Accurate as of October 25, 2018  3:58 PM. If you have any questions, ask your nurse or doctor.        STOP taking these medications   amLODipine 2.5 MG tablet Commonly known as: NORVASC Stopped by: Ruqayya Ventress, PA-C   aspirin 81 MG tablet Stopped by: Bill Mcvey, PA-C   clopidogrel 75 MG tablet Commonly known as: PLAVIX Stopped by: Kishana Battey, PA-C     TAKE these medications   donepezil 5 MG tablet Commonly known as: ARICEPT Take by mouth.   doxycycline 100 MG tablet Commonly known as: VIBRA-TABS Take by mouth.   Eliquis 2.5 MG Tabs  tablet Generic drug: apixaban Take 2.5 mg by mouth 2 (two) times daily.   lisinopril 10 MG tablet Commonly known as: ZESTRIL Take 10 mg by mouth 2 (two) times daily. What changed: Another medication with the same name was removed. Continue taking this medication, and follow the directions you see here. Changed by: Michiel CowboySHANNON Jala Dundon, PA-C   metoprolol succinate 50 MG 24 hr tablet Commonly known as: TOPROL-XL Take by mouth.   mirtazapine 7.5 MG tablet Commonly known as: REMERON TAKE 1 TABLET BY MOUTH NIGHTLY FOR APPETITE STIMULANT   multivitamin tablet Take 1 tablet by mouth daily.   pravastatin 20 MG tablet Commonly known as: PRAVACHOL Take by mouth.   VITAMIN B 12 PO Take by mouth.       Allergies:  Allergies  Allergen Reactions  . Aspirin     Other reaction(s): Other (See Comments) Risk exceeds benefit while on Eliquis  . Lisinopril-Hydrochlorothiazide Other (See Comments)    Renal insufficiency     Family History: Family History  Problem Relation Age of Onset  . Prostate cancer Neg Hx   . Bladder Cancer Neg Hx   . Kidney cancer Neg Hx     Social History:  reports that he quit smoking about 43 years ago. His smoking use included cigars. He has never used smokeless tobacco. He reports that  he does not drink alcohol or use drugs.  ROS: UROLOGY Frequent Urination?: No Hard to postpone urination?: No Burning/pain with urination?: No Get up at night to urinate?: No Leakage of urine?: No Urine stream starts and stops?: No Trouble starting stream?: No Do you have to strain to urinate?: No Blood in urine?: No Urinary tract infection?: No Sexually transmitted disease?: No Injury to kidneys or bladder?: No Painful intercourse?: No Weak stream?: No Erection problems?: No Penile pain?: No  Gastrointestinal Nausea?: No Vomiting?: No Indigestion/heartburn?: No Diarrhea?: Yes Constipation?: No  Constitutional Fever: No Night sweats?: No Weight loss?:  Yes Fatigue?: Yes  Skin Skin rash/lesions?: No Itching?: No  Eyes Blurred vision?: No Double vision?: No  Ears/Nose/Throat Sore throat?: No Sinus problems?: No  Hematologic/Lymphatic Swollen glands?: No Easy bruising?: No  Cardiovascular Leg swelling?: Yes Chest pain?: No  Respiratory Cough?: No Shortness of breath?: No  Endocrine Excessive thirst?: No  Musculoskeletal Back pain?: No Joint pain?: Yes  Neurological Headaches?: No Dizziness?: No  Psychologic Depression?: No Anxiety?: No  Physical Exam: BP 90/63 (BP Location: Left Arm, Patient Position: Sitting, Cuff Size: Normal)   Pulse (!) 112   Ht 6' (1.829 m)   Wt 143 lb (64.9 kg)   BMI 19.39 kg/m   Constitutional:  Well nourished. Alert and oriented, No acute distress.  In wheelchair.   HEENT: [place] AT, moist mucus membranes.  Trachea midline, no masses. Cardiovascular: No clubbing, cyanosis, or edema. Respiratory: Normal respiratory effort, no increased work of breathing. Neurologic: Grossly intact, no focal deficits, moving all 4 extremities. Psychiatric: Normal mood and affect.   Laboratory Data: Cr 2.0 on 05/2017  Urinalysis Component     Latest Ref Rng & Units 07/10/2017  Color, Urine     YELLOW YELLOW  Appearance     CLEAR CLEAR  Specific Gravity, Urine     1.005 - 1.030 1.015  pH     5.0 - 8.0 6.0  Glucose     NEGATIVE mg/dL NEGATIVE  Hgb urine dipstick     NEGATIVE NEGATIVE  Bilirubin Urine     NEGATIVE NEGATIVE  Ketones, ur     NEGATIVE mg/dL NEGATIVE  Protein     NEGATIVE mg/dL NEGATIVE  Nitrite     NEGATIVE NEGATIVE  Leukocytes, UA     NEGATIVE NEGATIVE  Squamous Epithelial / LPF     0 - 5 0-5  WBC, UA     0 - 5 WBC/hpf 0-5  RBC / HPF     0 - 5 RBC/hpf 0-5  Bacteria, UA     NONE SEEN NONE SEEN    Pertinent Imaging: No new interval imaging  Assessment & Plan:    1. History of hematuria (high risk) No evidence of ongoing microscopic hematuria Status post  negative evaluation in 2018 Given no recurrence, he may follow-up on as-needed basis  2. Elevated PSA Discussed the AUA Guideline's (2013) for men aged 71+ years or any man with less than a 10 to 15 year life expectancy that screening is not recommended and the threshold for biopsy should be raised to 10 ng/mL  I do not recommend further screening at this time as the diagnosis of prostate cancer would involve more invasive and risky procedures for diagnosis RTC prn   Zara Council, PA-C  [facility] [address] [place], [place] [address] [phone]

## 2018-10-25 ENCOUNTER — Ambulatory Visit (INDEPENDENT_AMBULATORY_CARE_PROVIDER_SITE_OTHER): Payer: Medicare Other | Admitting: Urology

## 2018-10-25 ENCOUNTER — Other Ambulatory Visit: Payer: Self-pay

## 2018-10-25 ENCOUNTER — Encounter: Payer: Self-pay | Admitting: Urology

## 2018-10-25 VITALS — BP 90/63 | HR 112 | Ht 72.0 in | Wt 143.0 lb

## 2018-10-25 DIAGNOSIS — Z87448 Personal history of other diseases of urinary system: Secondary | ICD-10-CM

## 2018-10-25 DIAGNOSIS — R972 Elevated prostate specific antigen [PSA]: Secondary | ICD-10-CM | POA: Diagnosis not present

## 2019-01-10 DIAGNOSIS — N183 Chronic kidney disease, stage 3 unspecified: Secondary | ICD-10-CM | POA: Diagnosis not present

## 2019-01-10 DIAGNOSIS — I739 Peripheral vascular disease, unspecified: Secondary | ICD-10-CM | POA: Diagnosis not present

## 2019-01-10 DIAGNOSIS — T8131XS Disruption of external operation (surgical) wound, not elsewhere classified, sequela: Secondary | ICD-10-CM | POA: Diagnosis not present

## 2019-01-10 DIAGNOSIS — Z89431 Acquired absence of right foot: Secondary | ICD-10-CM | POA: Diagnosis not present

## 2019-01-21 DIAGNOSIS — M869 Osteomyelitis, unspecified: Secondary | ICD-10-CM | POA: Diagnosis not present

## 2019-01-26 DIAGNOSIS — N183 Chronic kidney disease, stage 3 unspecified: Secondary | ICD-10-CM | POA: Diagnosis not present

## 2019-01-26 DIAGNOSIS — I739 Peripheral vascular disease, unspecified: Secondary | ICD-10-CM | POA: Diagnosis not present

## 2019-01-26 DIAGNOSIS — M86671 Other chronic osteomyelitis, right ankle and foot: Secondary | ICD-10-CM | POA: Diagnosis not present

## 2019-01-26 DIAGNOSIS — T8131XS Disruption of external operation (surgical) wound, not elsewhere classified, sequela: Secondary | ICD-10-CM | POA: Diagnosis not present

## 2019-02-09 DIAGNOSIS — I739 Peripheral vascular disease, unspecified: Secondary | ICD-10-CM | POA: Diagnosis not present

## 2019-02-09 DIAGNOSIS — I129 Hypertensive chronic kidney disease with stage 1 through stage 4 chronic kidney disease, or unspecified chronic kidney disease: Secondary | ICD-10-CM | POA: Diagnosis not present

## 2019-02-09 DIAGNOSIS — I4891 Unspecified atrial fibrillation: Secondary | ICD-10-CM | POA: Diagnosis not present

## 2019-02-09 DIAGNOSIS — T8781 Dehiscence of amputation stump: Secondary | ICD-10-CM | POA: Diagnosis not present

## 2019-02-09 DIAGNOSIS — N183 Chronic kidney disease, stage 3 unspecified: Secondary | ICD-10-CM | POA: Diagnosis not present

## 2019-02-09 DIAGNOSIS — F0390 Unspecified dementia without behavioral disturbance: Secondary | ICD-10-CM | POA: Diagnosis not present

## 2019-02-09 DIAGNOSIS — E7800 Pure hypercholesterolemia, unspecified: Secondary | ICD-10-CM | POA: Diagnosis not present

## 2019-02-09 DIAGNOSIS — E43 Unspecified severe protein-calorie malnutrition: Secondary | ICD-10-CM | POA: Diagnosis not present

## 2019-02-09 DIAGNOSIS — D631 Anemia in chronic kidney disease: Secondary | ICD-10-CM | POA: Diagnosis not present

## 2019-02-14 DIAGNOSIS — D631 Anemia in chronic kidney disease: Secondary | ICD-10-CM | POA: Diagnosis not present

## 2019-02-14 DIAGNOSIS — N183 Chronic kidney disease, stage 3 unspecified: Secondary | ICD-10-CM | POA: Diagnosis not present

## 2019-02-14 DIAGNOSIS — F0390 Unspecified dementia without behavioral disturbance: Secondary | ICD-10-CM | POA: Diagnosis not present

## 2019-02-14 DIAGNOSIS — I129 Hypertensive chronic kidney disease with stage 1 through stage 4 chronic kidney disease, or unspecified chronic kidney disease: Secondary | ICD-10-CM | POA: Diagnosis not present

## 2019-02-14 DIAGNOSIS — E7800 Pure hypercholesterolemia, unspecified: Secondary | ICD-10-CM | POA: Diagnosis not present

## 2019-02-14 DIAGNOSIS — T8781 Dehiscence of amputation stump: Secondary | ICD-10-CM | POA: Diagnosis not present

## 2019-02-14 DIAGNOSIS — I739 Peripheral vascular disease, unspecified: Secondary | ICD-10-CM | POA: Diagnosis not present

## 2019-02-14 DIAGNOSIS — I4891 Unspecified atrial fibrillation: Secondary | ICD-10-CM | POA: Diagnosis not present

## 2019-02-14 DIAGNOSIS — E43 Unspecified severe protein-calorie malnutrition: Secondary | ICD-10-CM | POA: Diagnosis not present

## 2019-02-16 DIAGNOSIS — I739 Peripheral vascular disease, unspecified: Secondary | ICD-10-CM | POA: Diagnosis not present

## 2019-02-16 DIAGNOSIS — F0390 Unspecified dementia without behavioral disturbance: Secondary | ICD-10-CM | POA: Diagnosis not present

## 2019-02-16 DIAGNOSIS — D631 Anemia in chronic kidney disease: Secondary | ICD-10-CM | POA: Diagnosis not present

## 2019-02-16 DIAGNOSIS — T8781 Dehiscence of amputation stump: Secondary | ICD-10-CM | POA: Diagnosis not present

## 2019-02-16 DIAGNOSIS — N183 Chronic kidney disease, stage 3 unspecified: Secondary | ICD-10-CM | POA: Diagnosis not present

## 2019-02-16 DIAGNOSIS — I4891 Unspecified atrial fibrillation: Secondary | ICD-10-CM | POA: Diagnosis not present

## 2019-02-16 DIAGNOSIS — E7800 Pure hypercholesterolemia, unspecified: Secondary | ICD-10-CM | POA: Diagnosis not present

## 2019-02-16 DIAGNOSIS — E43 Unspecified severe protein-calorie malnutrition: Secondary | ICD-10-CM | POA: Diagnosis not present

## 2019-02-16 DIAGNOSIS — I129 Hypertensive chronic kidney disease with stage 1 through stage 4 chronic kidney disease, or unspecified chronic kidney disease: Secondary | ICD-10-CM | POA: Diagnosis not present

## 2019-02-18 DIAGNOSIS — F0390 Unspecified dementia without behavioral disturbance: Secondary | ICD-10-CM | POA: Diagnosis not present

## 2019-02-18 DIAGNOSIS — D631 Anemia in chronic kidney disease: Secondary | ICD-10-CM | POA: Diagnosis not present

## 2019-02-18 DIAGNOSIS — I739 Peripheral vascular disease, unspecified: Secondary | ICD-10-CM | POA: Diagnosis not present

## 2019-02-18 DIAGNOSIS — E43 Unspecified severe protein-calorie malnutrition: Secondary | ICD-10-CM | POA: Diagnosis not present

## 2019-02-18 DIAGNOSIS — I129 Hypertensive chronic kidney disease with stage 1 through stage 4 chronic kidney disease, or unspecified chronic kidney disease: Secondary | ICD-10-CM | POA: Diagnosis not present

## 2019-02-18 DIAGNOSIS — I4891 Unspecified atrial fibrillation: Secondary | ICD-10-CM | POA: Diagnosis not present

## 2019-02-18 DIAGNOSIS — E7800 Pure hypercholesterolemia, unspecified: Secondary | ICD-10-CM | POA: Diagnosis not present

## 2019-02-18 DIAGNOSIS — T8781 Dehiscence of amputation stump: Secondary | ICD-10-CM | POA: Diagnosis not present

## 2019-02-18 DIAGNOSIS — N183 Chronic kidney disease, stage 3 unspecified: Secondary | ICD-10-CM | POA: Diagnosis not present

## 2019-02-20 DIAGNOSIS — M869 Osteomyelitis, unspecified: Secondary | ICD-10-CM | POA: Diagnosis not present

## 2019-02-22 DIAGNOSIS — I739 Peripheral vascular disease, unspecified: Secondary | ICD-10-CM | POA: Diagnosis not present

## 2019-02-22 DIAGNOSIS — E7800 Pure hypercholesterolemia, unspecified: Secondary | ICD-10-CM | POA: Diagnosis not present

## 2019-02-22 DIAGNOSIS — Z9119 Patient's noncompliance with other medical treatment and regimen: Secondary | ICD-10-CM | POA: Diagnosis not present

## 2019-02-22 DIAGNOSIS — F0390 Unspecified dementia without behavioral disturbance: Secondary | ICD-10-CM | POA: Diagnosis not present

## 2019-02-22 DIAGNOSIS — I4891 Unspecified atrial fibrillation: Secondary | ICD-10-CM | POA: Diagnosis not present

## 2019-02-22 DIAGNOSIS — Z89421 Acquired absence of other right toe(s): Secondary | ICD-10-CM | POA: Diagnosis not present

## 2019-02-22 DIAGNOSIS — Z9889 Other specified postprocedural states: Secondary | ICD-10-CM | POA: Diagnosis not present

## 2019-02-22 DIAGNOSIS — I129 Hypertensive chronic kidney disease with stage 1 through stage 4 chronic kidney disease, or unspecified chronic kidney disease: Secondary | ICD-10-CM | POA: Diagnosis not present

## 2019-02-22 DIAGNOSIS — I998 Other disorder of circulatory system: Secondary | ICD-10-CM | POA: Diagnosis not present

## 2019-02-22 DIAGNOSIS — D631 Anemia in chronic kidney disease: Secondary | ICD-10-CM | POA: Diagnosis not present

## 2019-02-22 DIAGNOSIS — T8781 Dehiscence of amputation stump: Secondary | ICD-10-CM | POA: Diagnosis not present

## 2019-02-22 DIAGNOSIS — L089 Local infection of the skin and subcutaneous tissue, unspecified: Secondary | ICD-10-CM | POA: Diagnosis not present

## 2019-02-22 DIAGNOSIS — E43 Unspecified severe protein-calorie malnutrition: Secondary | ICD-10-CM | POA: Diagnosis not present

## 2019-02-22 DIAGNOSIS — N183 Chronic kidney disease, stage 3 unspecified: Secondary | ICD-10-CM | POA: Diagnosis not present

## 2019-02-22 DIAGNOSIS — T8131XD Disruption of external operation (surgical) wound, not elsewhere classified, subsequent encounter: Secondary | ICD-10-CM | POA: Diagnosis not present

## 2019-02-24 DIAGNOSIS — E7800 Pure hypercholesterolemia, unspecified: Secondary | ICD-10-CM | POA: Diagnosis not present

## 2019-02-24 DIAGNOSIS — N183 Chronic kidney disease, stage 3 unspecified: Secondary | ICD-10-CM | POA: Diagnosis not present

## 2019-02-24 DIAGNOSIS — I4891 Unspecified atrial fibrillation: Secondary | ICD-10-CM | POA: Diagnosis not present

## 2019-02-24 DIAGNOSIS — E43 Unspecified severe protein-calorie malnutrition: Secondary | ICD-10-CM | POA: Diagnosis not present

## 2019-02-24 DIAGNOSIS — T8781 Dehiscence of amputation stump: Secondary | ICD-10-CM | POA: Diagnosis not present

## 2019-02-24 DIAGNOSIS — I129 Hypertensive chronic kidney disease with stage 1 through stage 4 chronic kidney disease, or unspecified chronic kidney disease: Secondary | ICD-10-CM | POA: Diagnosis not present

## 2019-02-24 DIAGNOSIS — D631 Anemia in chronic kidney disease: Secondary | ICD-10-CM | POA: Diagnosis not present

## 2019-02-24 DIAGNOSIS — F0390 Unspecified dementia without behavioral disturbance: Secondary | ICD-10-CM | POA: Diagnosis not present

## 2019-02-24 DIAGNOSIS — I739 Peripheral vascular disease, unspecified: Secondary | ICD-10-CM | POA: Diagnosis not present

## 2019-02-28 DIAGNOSIS — D631 Anemia in chronic kidney disease: Secondary | ICD-10-CM | POA: Diagnosis not present

## 2019-02-28 DIAGNOSIS — I739 Peripheral vascular disease, unspecified: Secondary | ICD-10-CM | POA: Diagnosis not present

## 2019-02-28 DIAGNOSIS — T8781 Dehiscence of amputation stump: Secondary | ICD-10-CM | POA: Diagnosis not present

## 2019-02-28 DIAGNOSIS — N183 Chronic kidney disease, stage 3 unspecified: Secondary | ICD-10-CM | POA: Diagnosis not present

## 2019-02-28 DIAGNOSIS — I129 Hypertensive chronic kidney disease with stage 1 through stage 4 chronic kidney disease, or unspecified chronic kidney disease: Secondary | ICD-10-CM | POA: Diagnosis not present

## 2019-02-28 DIAGNOSIS — E7800 Pure hypercholesterolemia, unspecified: Secondary | ICD-10-CM | POA: Diagnosis not present

## 2019-02-28 DIAGNOSIS — E43 Unspecified severe protein-calorie malnutrition: Secondary | ICD-10-CM | POA: Diagnosis not present

## 2019-02-28 DIAGNOSIS — I4891 Unspecified atrial fibrillation: Secondary | ICD-10-CM | POA: Diagnosis not present

## 2019-02-28 DIAGNOSIS — F0390 Unspecified dementia without behavioral disturbance: Secondary | ICD-10-CM | POA: Diagnosis not present

## 2019-03-01 DIAGNOSIS — T8781 Dehiscence of amputation stump: Secondary | ICD-10-CM | POA: Diagnosis not present

## 2019-03-01 DIAGNOSIS — I4891 Unspecified atrial fibrillation: Secondary | ICD-10-CM | POA: Diagnosis not present

## 2019-03-01 DIAGNOSIS — I129 Hypertensive chronic kidney disease with stage 1 through stage 4 chronic kidney disease, or unspecified chronic kidney disease: Secondary | ICD-10-CM | POA: Diagnosis not present

## 2019-03-01 DIAGNOSIS — E7800 Pure hypercholesterolemia, unspecified: Secondary | ICD-10-CM | POA: Diagnosis not present

## 2019-03-01 DIAGNOSIS — D631 Anemia in chronic kidney disease: Secondary | ICD-10-CM | POA: Diagnosis not present

## 2019-03-01 DIAGNOSIS — N183 Chronic kidney disease, stage 3 unspecified: Secondary | ICD-10-CM | POA: Diagnosis not present

## 2019-03-01 DIAGNOSIS — F0390 Unspecified dementia without behavioral disturbance: Secondary | ICD-10-CM | POA: Diagnosis not present

## 2019-03-01 DIAGNOSIS — E43 Unspecified severe protein-calorie malnutrition: Secondary | ICD-10-CM | POA: Diagnosis not present

## 2019-03-01 DIAGNOSIS — I739 Peripheral vascular disease, unspecified: Secondary | ICD-10-CM | POA: Diagnosis not present

## 2019-03-02 DIAGNOSIS — E7800 Pure hypercholesterolemia, unspecified: Secondary | ICD-10-CM | POA: Diagnosis not present

## 2019-03-02 DIAGNOSIS — I129 Hypertensive chronic kidney disease with stage 1 through stage 4 chronic kidney disease, or unspecified chronic kidney disease: Secondary | ICD-10-CM | POA: Diagnosis not present

## 2019-03-02 DIAGNOSIS — F0390 Unspecified dementia without behavioral disturbance: Secondary | ICD-10-CM | POA: Diagnosis not present

## 2019-03-02 DIAGNOSIS — N183 Chronic kidney disease, stage 3 unspecified: Secondary | ICD-10-CM | POA: Diagnosis not present

## 2019-03-02 DIAGNOSIS — I739 Peripheral vascular disease, unspecified: Secondary | ICD-10-CM | POA: Diagnosis not present

## 2019-03-02 DIAGNOSIS — T8781 Dehiscence of amputation stump: Secondary | ICD-10-CM | POA: Diagnosis not present

## 2019-03-02 DIAGNOSIS — E43 Unspecified severe protein-calorie malnutrition: Secondary | ICD-10-CM | POA: Diagnosis not present

## 2019-03-02 DIAGNOSIS — I4891 Unspecified atrial fibrillation: Secondary | ICD-10-CM | POA: Diagnosis not present

## 2019-03-02 DIAGNOSIS — D631 Anemia in chronic kidney disease: Secondary | ICD-10-CM | POA: Diagnosis not present

## 2019-03-07 DIAGNOSIS — I4891 Unspecified atrial fibrillation: Secondary | ICD-10-CM | POA: Diagnosis not present

## 2019-03-07 DIAGNOSIS — E43 Unspecified severe protein-calorie malnutrition: Secondary | ICD-10-CM | POA: Diagnosis not present

## 2019-03-07 DIAGNOSIS — T8781 Dehiscence of amputation stump: Secondary | ICD-10-CM | POA: Diagnosis not present

## 2019-03-07 DIAGNOSIS — I129 Hypertensive chronic kidney disease with stage 1 through stage 4 chronic kidney disease, or unspecified chronic kidney disease: Secondary | ICD-10-CM | POA: Diagnosis not present

## 2019-03-07 DIAGNOSIS — N183 Chronic kidney disease, stage 3 unspecified: Secondary | ICD-10-CM | POA: Diagnosis not present

## 2019-03-07 DIAGNOSIS — F0390 Unspecified dementia without behavioral disturbance: Secondary | ICD-10-CM | POA: Diagnosis not present

## 2019-03-07 DIAGNOSIS — D631 Anemia in chronic kidney disease: Secondary | ICD-10-CM | POA: Diagnosis not present

## 2019-03-07 DIAGNOSIS — E7800 Pure hypercholesterolemia, unspecified: Secondary | ICD-10-CM | POA: Diagnosis not present

## 2019-03-07 DIAGNOSIS — I739 Peripheral vascular disease, unspecified: Secondary | ICD-10-CM | POA: Diagnosis not present

## 2019-03-09 DIAGNOSIS — Z89431 Acquired absence of right foot: Secondary | ICD-10-CM | POA: Diagnosis not present

## 2019-03-09 DIAGNOSIS — M86671 Other chronic osteomyelitis, right ankle and foot: Secondary | ICD-10-CM | POA: Diagnosis not present

## 2019-03-09 DIAGNOSIS — I739 Peripheral vascular disease, unspecified: Secondary | ICD-10-CM | POA: Diagnosis not present

## 2019-03-09 DIAGNOSIS — N183 Chronic kidney disease, stage 3 unspecified: Secondary | ICD-10-CM | POA: Diagnosis not present

## 2019-03-09 DIAGNOSIS — T8131XD Disruption of external operation (surgical) wound, not elsewhere classified, subsequent encounter: Secondary | ICD-10-CM | POA: Diagnosis not present

## 2019-03-10 DIAGNOSIS — Z0000 Encounter for general adult medical examination without abnormal findings: Secondary | ICD-10-CM | POA: Diagnosis not present

## 2019-03-10 DIAGNOSIS — M869 Osteomyelitis, unspecified: Secondary | ICD-10-CM | POA: Diagnosis not present

## 2019-03-10 DIAGNOSIS — R634 Abnormal weight loss: Secondary | ICD-10-CM | POA: Diagnosis not present

## 2019-03-10 DIAGNOSIS — D631 Anemia in chronic kidney disease: Secondary | ICD-10-CM | POA: Diagnosis not present

## 2019-03-10 DIAGNOSIS — E43 Unspecified severe protein-calorie malnutrition: Secondary | ICD-10-CM | POA: Diagnosis not present

## 2019-03-10 DIAGNOSIS — I10 Essential (primary) hypertension: Secondary | ICD-10-CM | POA: Diagnosis not present

## 2019-03-10 DIAGNOSIS — I4891 Unspecified atrial fibrillation: Secondary | ICD-10-CM | POA: Diagnosis not present

## 2019-03-10 DIAGNOSIS — N183 Chronic kidney disease, stage 3 unspecified: Secondary | ICD-10-CM | POA: Diagnosis not present

## 2019-03-10 DIAGNOSIS — Z7189 Other specified counseling: Secondary | ICD-10-CM | POA: Diagnosis not present

## 2019-03-10 DIAGNOSIS — T8781 Dehiscence of amputation stump: Secondary | ICD-10-CM | POA: Diagnosis not present

## 2019-03-10 DIAGNOSIS — E7800 Pure hypercholesterolemia, unspecified: Secondary | ICD-10-CM | POA: Diagnosis not present

## 2019-03-10 DIAGNOSIS — I129 Hypertensive chronic kidney disease with stage 1 through stage 4 chronic kidney disease, or unspecified chronic kidney disease: Secondary | ICD-10-CM | POA: Diagnosis not present

## 2019-03-10 DIAGNOSIS — F0390 Unspecified dementia without behavioral disturbance: Secondary | ICD-10-CM | POA: Diagnosis not present

## 2019-03-10 DIAGNOSIS — Z79899 Other long term (current) drug therapy: Secondary | ICD-10-CM | POA: Diagnosis not present

## 2019-03-10 DIAGNOSIS — I739 Peripheral vascular disease, unspecified: Secondary | ICD-10-CM | POA: Diagnosis not present

## 2019-03-11 DIAGNOSIS — I129 Hypertensive chronic kidney disease with stage 1 through stage 4 chronic kidney disease, or unspecified chronic kidney disease: Secondary | ICD-10-CM | POA: Diagnosis not present

## 2019-03-11 DIAGNOSIS — I4891 Unspecified atrial fibrillation: Secondary | ICD-10-CM | POA: Diagnosis not present

## 2019-03-11 DIAGNOSIS — N183 Chronic kidney disease, stage 3 unspecified: Secondary | ICD-10-CM | POA: Diagnosis not present

## 2019-03-11 DIAGNOSIS — E43 Unspecified severe protein-calorie malnutrition: Secondary | ICD-10-CM | POA: Diagnosis not present

## 2019-03-11 DIAGNOSIS — D631 Anemia in chronic kidney disease: Secondary | ICD-10-CM | POA: Diagnosis not present

## 2019-03-11 DIAGNOSIS — T8781 Dehiscence of amputation stump: Secondary | ICD-10-CM | POA: Diagnosis not present

## 2019-03-11 DIAGNOSIS — F0390 Unspecified dementia without behavioral disturbance: Secondary | ICD-10-CM | POA: Diagnosis not present

## 2019-03-11 DIAGNOSIS — I739 Peripheral vascular disease, unspecified: Secondary | ICD-10-CM | POA: Diagnosis not present

## 2019-03-11 DIAGNOSIS — E7800 Pure hypercholesterolemia, unspecified: Secondary | ICD-10-CM | POA: Diagnosis not present

## 2019-03-14 DIAGNOSIS — E43 Unspecified severe protein-calorie malnutrition: Secondary | ICD-10-CM | POA: Diagnosis not present

## 2019-03-14 DIAGNOSIS — T8781 Dehiscence of amputation stump: Secondary | ICD-10-CM | POA: Diagnosis not present

## 2019-03-14 DIAGNOSIS — E7800 Pure hypercholesterolemia, unspecified: Secondary | ICD-10-CM | POA: Diagnosis not present

## 2019-03-14 DIAGNOSIS — I4891 Unspecified atrial fibrillation: Secondary | ICD-10-CM | POA: Diagnosis not present

## 2019-03-14 DIAGNOSIS — I129 Hypertensive chronic kidney disease with stage 1 through stage 4 chronic kidney disease, or unspecified chronic kidney disease: Secondary | ICD-10-CM | POA: Diagnosis not present

## 2019-03-14 DIAGNOSIS — N183 Chronic kidney disease, stage 3 unspecified: Secondary | ICD-10-CM | POA: Diagnosis not present

## 2019-03-14 DIAGNOSIS — I739 Peripheral vascular disease, unspecified: Secondary | ICD-10-CM | POA: Diagnosis not present

## 2019-03-14 DIAGNOSIS — F0390 Unspecified dementia without behavioral disturbance: Secondary | ICD-10-CM | POA: Diagnosis not present

## 2019-03-14 DIAGNOSIS — D631 Anemia in chronic kidney disease: Secondary | ICD-10-CM | POA: Diagnosis not present

## 2019-03-17 DIAGNOSIS — E7800 Pure hypercholesterolemia, unspecified: Secondary | ICD-10-CM | POA: Diagnosis not present

## 2019-03-17 DIAGNOSIS — I4891 Unspecified atrial fibrillation: Secondary | ICD-10-CM | POA: Diagnosis not present

## 2019-03-17 DIAGNOSIS — I129 Hypertensive chronic kidney disease with stage 1 through stage 4 chronic kidney disease, or unspecified chronic kidney disease: Secondary | ICD-10-CM | POA: Diagnosis not present

## 2019-03-17 DIAGNOSIS — F0390 Unspecified dementia without behavioral disturbance: Secondary | ICD-10-CM | POA: Diagnosis not present

## 2019-03-17 DIAGNOSIS — E43 Unspecified severe protein-calorie malnutrition: Secondary | ICD-10-CM | POA: Diagnosis not present

## 2019-03-17 DIAGNOSIS — I739 Peripheral vascular disease, unspecified: Secondary | ICD-10-CM | POA: Diagnosis not present

## 2019-03-17 DIAGNOSIS — N183 Chronic kidney disease, stage 3 unspecified: Secondary | ICD-10-CM | POA: Diagnosis not present

## 2019-03-17 DIAGNOSIS — D631 Anemia in chronic kidney disease: Secondary | ICD-10-CM | POA: Diagnosis not present

## 2019-03-17 DIAGNOSIS — T8781 Dehiscence of amputation stump: Secondary | ICD-10-CM | POA: Diagnosis not present

## 2019-03-22 DIAGNOSIS — E43 Unspecified severe protein-calorie malnutrition: Secondary | ICD-10-CM | POA: Diagnosis not present

## 2019-03-22 DIAGNOSIS — I129 Hypertensive chronic kidney disease with stage 1 through stage 4 chronic kidney disease, or unspecified chronic kidney disease: Secondary | ICD-10-CM | POA: Diagnosis not present

## 2019-03-22 DIAGNOSIS — T8781 Dehiscence of amputation stump: Secondary | ICD-10-CM | POA: Diagnosis not present

## 2019-03-22 DIAGNOSIS — E7800 Pure hypercholesterolemia, unspecified: Secondary | ICD-10-CM | POA: Diagnosis not present

## 2019-03-22 DIAGNOSIS — N183 Chronic kidney disease, stage 3 unspecified: Secondary | ICD-10-CM | POA: Diagnosis not present

## 2019-03-22 DIAGNOSIS — D631 Anemia in chronic kidney disease: Secondary | ICD-10-CM | POA: Diagnosis not present

## 2019-03-22 DIAGNOSIS — F0390 Unspecified dementia without behavioral disturbance: Secondary | ICD-10-CM | POA: Diagnosis not present

## 2019-03-22 DIAGNOSIS — I739 Peripheral vascular disease, unspecified: Secondary | ICD-10-CM | POA: Diagnosis not present

## 2019-03-22 DIAGNOSIS — I4891 Unspecified atrial fibrillation: Secondary | ICD-10-CM | POA: Diagnosis not present

## 2019-03-23 DIAGNOSIS — M869 Osteomyelitis, unspecified: Secondary | ICD-10-CM | POA: Diagnosis not present

## 2019-03-23 DIAGNOSIS — E7800 Pure hypercholesterolemia, unspecified: Secondary | ICD-10-CM | POA: Diagnosis not present

## 2019-03-23 DIAGNOSIS — I739 Peripheral vascular disease, unspecified: Secondary | ICD-10-CM | POA: Diagnosis not present

## 2019-03-23 DIAGNOSIS — D631 Anemia in chronic kidney disease: Secondary | ICD-10-CM | POA: Diagnosis not present

## 2019-03-23 DIAGNOSIS — E43 Unspecified severe protein-calorie malnutrition: Secondary | ICD-10-CM | POA: Diagnosis not present

## 2019-03-23 DIAGNOSIS — F0390 Unspecified dementia without behavioral disturbance: Secondary | ICD-10-CM | POA: Diagnosis not present

## 2019-03-23 DIAGNOSIS — I4891 Unspecified atrial fibrillation: Secondary | ICD-10-CM | POA: Diagnosis not present

## 2019-03-23 DIAGNOSIS — N183 Chronic kidney disease, stage 3 unspecified: Secondary | ICD-10-CM | POA: Diagnosis not present

## 2019-03-23 DIAGNOSIS — I129 Hypertensive chronic kidney disease with stage 1 through stage 4 chronic kidney disease, or unspecified chronic kidney disease: Secondary | ICD-10-CM | POA: Diagnosis not present

## 2019-03-23 DIAGNOSIS — T8781 Dehiscence of amputation stump: Secondary | ICD-10-CM | POA: Diagnosis not present

## 2019-03-24 DIAGNOSIS — E7800 Pure hypercholesterolemia, unspecified: Secondary | ICD-10-CM | POA: Diagnosis not present

## 2019-03-24 DIAGNOSIS — N183 Chronic kidney disease, stage 3 unspecified: Secondary | ICD-10-CM | POA: Diagnosis not present

## 2019-03-24 DIAGNOSIS — F0390 Unspecified dementia without behavioral disturbance: Secondary | ICD-10-CM | POA: Diagnosis not present

## 2019-03-24 DIAGNOSIS — I739 Peripheral vascular disease, unspecified: Secondary | ICD-10-CM | POA: Diagnosis not present

## 2019-03-24 DIAGNOSIS — D631 Anemia in chronic kidney disease: Secondary | ICD-10-CM | POA: Diagnosis not present

## 2019-03-24 DIAGNOSIS — E43 Unspecified severe protein-calorie malnutrition: Secondary | ICD-10-CM | POA: Diagnosis not present

## 2019-03-24 DIAGNOSIS — I4891 Unspecified atrial fibrillation: Secondary | ICD-10-CM | POA: Diagnosis not present

## 2019-03-24 DIAGNOSIS — I129 Hypertensive chronic kidney disease with stage 1 through stage 4 chronic kidney disease, or unspecified chronic kidney disease: Secondary | ICD-10-CM | POA: Diagnosis not present

## 2019-03-24 DIAGNOSIS — T8781 Dehiscence of amputation stump: Secondary | ICD-10-CM | POA: Diagnosis not present

## 2019-03-29 DIAGNOSIS — I4891 Unspecified atrial fibrillation: Secondary | ICD-10-CM | POA: Diagnosis not present

## 2019-03-29 DIAGNOSIS — E43 Unspecified severe protein-calorie malnutrition: Secondary | ICD-10-CM | POA: Diagnosis not present

## 2019-03-29 DIAGNOSIS — N183 Chronic kidney disease, stage 3 unspecified: Secondary | ICD-10-CM | POA: Diagnosis not present

## 2019-03-29 DIAGNOSIS — I739 Peripheral vascular disease, unspecified: Secondary | ICD-10-CM | POA: Diagnosis not present

## 2019-03-29 DIAGNOSIS — I129 Hypertensive chronic kidney disease with stage 1 through stage 4 chronic kidney disease, or unspecified chronic kidney disease: Secondary | ICD-10-CM | POA: Diagnosis not present

## 2019-03-29 DIAGNOSIS — T8781 Dehiscence of amputation stump: Secondary | ICD-10-CM | POA: Diagnosis not present

## 2019-03-29 DIAGNOSIS — F0390 Unspecified dementia without behavioral disturbance: Secondary | ICD-10-CM | POA: Diagnosis not present

## 2019-03-29 DIAGNOSIS — D631 Anemia in chronic kidney disease: Secondary | ICD-10-CM | POA: Diagnosis not present

## 2019-03-29 DIAGNOSIS — E7800 Pure hypercholesterolemia, unspecified: Secondary | ICD-10-CM | POA: Diagnosis not present

## 2019-03-30 DIAGNOSIS — I739 Peripheral vascular disease, unspecified: Secondary | ICD-10-CM | POA: Diagnosis not present

## 2019-03-30 DIAGNOSIS — T8781 Dehiscence of amputation stump: Secondary | ICD-10-CM | POA: Diagnosis not present

## 2019-03-30 DIAGNOSIS — E43 Unspecified severe protein-calorie malnutrition: Secondary | ICD-10-CM | POA: Diagnosis not present

## 2019-03-30 DIAGNOSIS — D631 Anemia in chronic kidney disease: Secondary | ICD-10-CM | POA: Diagnosis not present

## 2019-03-30 DIAGNOSIS — F0390 Unspecified dementia without behavioral disturbance: Secondary | ICD-10-CM | POA: Diagnosis not present

## 2019-03-30 DIAGNOSIS — I4891 Unspecified atrial fibrillation: Secondary | ICD-10-CM | POA: Diagnosis not present

## 2019-03-30 DIAGNOSIS — N183 Chronic kidney disease, stage 3 unspecified: Secondary | ICD-10-CM | POA: Diagnosis not present

## 2019-03-30 DIAGNOSIS — E7800 Pure hypercholesterolemia, unspecified: Secondary | ICD-10-CM | POA: Diagnosis not present

## 2019-03-30 DIAGNOSIS — I129 Hypertensive chronic kidney disease with stage 1 through stage 4 chronic kidney disease, or unspecified chronic kidney disease: Secondary | ICD-10-CM | POA: Diagnosis not present

## 2019-03-31 DIAGNOSIS — T8781 Dehiscence of amputation stump: Secondary | ICD-10-CM | POA: Diagnosis not present

## 2019-03-31 DIAGNOSIS — F0390 Unspecified dementia without behavioral disturbance: Secondary | ICD-10-CM | POA: Diagnosis not present

## 2019-03-31 DIAGNOSIS — E7800 Pure hypercholesterolemia, unspecified: Secondary | ICD-10-CM | POA: Diagnosis not present

## 2019-03-31 DIAGNOSIS — D631 Anemia in chronic kidney disease: Secondary | ICD-10-CM | POA: Diagnosis not present

## 2019-03-31 DIAGNOSIS — I4891 Unspecified atrial fibrillation: Secondary | ICD-10-CM | POA: Diagnosis not present

## 2019-03-31 DIAGNOSIS — I739 Peripheral vascular disease, unspecified: Secondary | ICD-10-CM | POA: Diagnosis not present

## 2019-03-31 DIAGNOSIS — E43 Unspecified severe protein-calorie malnutrition: Secondary | ICD-10-CM | POA: Diagnosis not present

## 2019-03-31 DIAGNOSIS — N183 Chronic kidney disease, stage 3 unspecified: Secondary | ICD-10-CM | POA: Diagnosis not present

## 2019-03-31 DIAGNOSIS — I129 Hypertensive chronic kidney disease with stage 1 through stage 4 chronic kidney disease, or unspecified chronic kidney disease: Secondary | ICD-10-CM | POA: Diagnosis not present

## 2019-04-04 DIAGNOSIS — I739 Peripheral vascular disease, unspecified: Secondary | ICD-10-CM | POA: Diagnosis not present

## 2019-04-04 DIAGNOSIS — E7800 Pure hypercholesterolemia, unspecified: Secondary | ICD-10-CM | POA: Diagnosis not present

## 2019-04-04 DIAGNOSIS — I129 Hypertensive chronic kidney disease with stage 1 through stage 4 chronic kidney disease, or unspecified chronic kidney disease: Secondary | ICD-10-CM | POA: Diagnosis not present

## 2019-04-04 DIAGNOSIS — I4891 Unspecified atrial fibrillation: Secondary | ICD-10-CM | POA: Diagnosis not present

## 2019-04-04 DIAGNOSIS — T8781 Dehiscence of amputation stump: Secondary | ICD-10-CM | POA: Diagnosis not present

## 2019-04-04 DIAGNOSIS — D631 Anemia in chronic kidney disease: Secondary | ICD-10-CM | POA: Diagnosis not present

## 2019-04-04 DIAGNOSIS — N183 Chronic kidney disease, stage 3 unspecified: Secondary | ICD-10-CM | POA: Diagnosis not present

## 2019-04-04 DIAGNOSIS — F0390 Unspecified dementia without behavioral disturbance: Secondary | ICD-10-CM | POA: Diagnosis not present

## 2019-04-04 DIAGNOSIS — E43 Unspecified severe protein-calorie malnutrition: Secondary | ICD-10-CM | POA: Diagnosis not present

## 2019-04-07 DIAGNOSIS — T8781 Dehiscence of amputation stump: Secondary | ICD-10-CM | POA: Diagnosis not present

## 2019-04-07 DIAGNOSIS — I739 Peripheral vascular disease, unspecified: Secondary | ICD-10-CM | POA: Diagnosis not present

## 2019-04-07 DIAGNOSIS — I129 Hypertensive chronic kidney disease with stage 1 through stage 4 chronic kidney disease, or unspecified chronic kidney disease: Secondary | ICD-10-CM | POA: Diagnosis not present

## 2019-04-07 DIAGNOSIS — F0390 Unspecified dementia without behavioral disturbance: Secondary | ICD-10-CM | POA: Diagnosis not present

## 2019-04-07 DIAGNOSIS — E7800 Pure hypercholesterolemia, unspecified: Secondary | ICD-10-CM | POA: Diagnosis not present

## 2019-04-07 DIAGNOSIS — N183 Chronic kidney disease, stage 3 unspecified: Secondary | ICD-10-CM | POA: Diagnosis not present

## 2019-04-07 DIAGNOSIS — E43 Unspecified severe protein-calorie malnutrition: Secondary | ICD-10-CM | POA: Diagnosis not present

## 2019-04-07 DIAGNOSIS — I4891 Unspecified atrial fibrillation: Secondary | ICD-10-CM | POA: Diagnosis not present

## 2019-04-07 DIAGNOSIS — D631 Anemia in chronic kidney disease: Secondary | ICD-10-CM | POA: Diagnosis not present

## 2019-04-08 DIAGNOSIS — I4891 Unspecified atrial fibrillation: Secondary | ICD-10-CM | POA: Diagnosis not present

## 2019-04-08 DIAGNOSIS — E7800 Pure hypercholesterolemia, unspecified: Secondary | ICD-10-CM | POA: Diagnosis not present

## 2019-04-08 DIAGNOSIS — N183 Chronic kidney disease, stage 3 unspecified: Secondary | ICD-10-CM | POA: Diagnosis not present

## 2019-04-08 DIAGNOSIS — I739 Peripheral vascular disease, unspecified: Secondary | ICD-10-CM | POA: Diagnosis not present

## 2019-04-08 DIAGNOSIS — E43 Unspecified severe protein-calorie malnutrition: Secondary | ICD-10-CM | POA: Diagnosis not present

## 2019-04-08 DIAGNOSIS — D631 Anemia in chronic kidney disease: Secondary | ICD-10-CM | POA: Diagnosis not present

## 2019-04-08 DIAGNOSIS — T8781 Dehiscence of amputation stump: Secondary | ICD-10-CM | POA: Diagnosis not present

## 2019-04-08 DIAGNOSIS — I129 Hypertensive chronic kidney disease with stage 1 through stage 4 chronic kidney disease, or unspecified chronic kidney disease: Secondary | ICD-10-CM | POA: Diagnosis not present

## 2019-04-08 DIAGNOSIS — F0390 Unspecified dementia without behavioral disturbance: Secondary | ICD-10-CM | POA: Diagnosis not present

## 2019-04-12 DIAGNOSIS — I4891 Unspecified atrial fibrillation: Secondary | ICD-10-CM | POA: Diagnosis not present

## 2019-04-12 DIAGNOSIS — N183 Chronic kidney disease, stage 3 unspecified: Secondary | ICD-10-CM | POA: Diagnosis not present

## 2019-04-12 DIAGNOSIS — E7800 Pure hypercholesterolemia, unspecified: Secondary | ICD-10-CM | POA: Diagnosis not present

## 2019-04-12 DIAGNOSIS — I739 Peripheral vascular disease, unspecified: Secondary | ICD-10-CM | POA: Diagnosis not present

## 2019-04-12 DIAGNOSIS — F0390 Unspecified dementia without behavioral disturbance: Secondary | ICD-10-CM | POA: Diagnosis not present

## 2019-04-12 DIAGNOSIS — T8781 Dehiscence of amputation stump: Secondary | ICD-10-CM | POA: Diagnosis not present

## 2019-04-12 DIAGNOSIS — E43 Unspecified severe protein-calorie malnutrition: Secondary | ICD-10-CM | POA: Diagnosis not present

## 2019-04-12 DIAGNOSIS — I129 Hypertensive chronic kidney disease with stage 1 through stage 4 chronic kidney disease, or unspecified chronic kidney disease: Secondary | ICD-10-CM | POA: Diagnosis not present

## 2019-04-12 DIAGNOSIS — D631 Anemia in chronic kidney disease: Secondary | ICD-10-CM | POA: Diagnosis not present

## 2019-04-14 DIAGNOSIS — E43 Unspecified severe protein-calorie malnutrition: Secondary | ICD-10-CM | POA: Diagnosis not present

## 2019-04-14 DIAGNOSIS — F0390 Unspecified dementia without behavioral disturbance: Secondary | ICD-10-CM | POA: Diagnosis not present

## 2019-04-14 DIAGNOSIS — I739 Peripheral vascular disease, unspecified: Secondary | ICD-10-CM | POA: Diagnosis not present

## 2019-04-14 DIAGNOSIS — N183 Chronic kidney disease, stage 3 unspecified: Secondary | ICD-10-CM | POA: Diagnosis not present

## 2019-04-14 DIAGNOSIS — D631 Anemia in chronic kidney disease: Secondary | ICD-10-CM | POA: Diagnosis not present

## 2019-04-14 DIAGNOSIS — E7800 Pure hypercholesterolemia, unspecified: Secondary | ICD-10-CM | POA: Diagnosis not present

## 2019-04-14 DIAGNOSIS — T8781 Dehiscence of amputation stump: Secondary | ICD-10-CM | POA: Diagnosis not present

## 2019-04-14 DIAGNOSIS — I4891 Unspecified atrial fibrillation: Secondary | ICD-10-CM | POA: Diagnosis not present

## 2019-04-14 DIAGNOSIS — I129 Hypertensive chronic kidney disease with stage 1 through stage 4 chronic kidney disease, or unspecified chronic kidney disease: Secondary | ICD-10-CM | POA: Diagnosis not present

## 2019-04-19 DIAGNOSIS — I129 Hypertensive chronic kidney disease with stage 1 through stage 4 chronic kidney disease, or unspecified chronic kidney disease: Secondary | ICD-10-CM | POA: Diagnosis not present

## 2019-04-19 DIAGNOSIS — I4891 Unspecified atrial fibrillation: Secondary | ICD-10-CM | POA: Diagnosis not present

## 2019-04-19 DIAGNOSIS — F0390 Unspecified dementia without behavioral disturbance: Secondary | ICD-10-CM | POA: Diagnosis not present

## 2019-04-19 DIAGNOSIS — N183 Chronic kidney disease, stage 3 unspecified: Secondary | ICD-10-CM | POA: Diagnosis not present

## 2019-04-19 DIAGNOSIS — I739 Peripheral vascular disease, unspecified: Secondary | ICD-10-CM | POA: Diagnosis not present

## 2019-04-19 DIAGNOSIS — D631 Anemia in chronic kidney disease: Secondary | ICD-10-CM | POA: Diagnosis not present

## 2019-04-19 DIAGNOSIS — E43 Unspecified severe protein-calorie malnutrition: Secondary | ICD-10-CM | POA: Diagnosis not present

## 2019-04-19 DIAGNOSIS — E7800 Pure hypercholesterolemia, unspecified: Secondary | ICD-10-CM | POA: Diagnosis not present

## 2019-04-19 DIAGNOSIS — T8781 Dehiscence of amputation stump: Secondary | ICD-10-CM | POA: Diagnosis not present

## 2019-04-23 DIAGNOSIS — M869 Osteomyelitis, unspecified: Secondary | ICD-10-CM | POA: Diagnosis not present

## 2019-04-26 DIAGNOSIS — E7800 Pure hypercholesterolemia, unspecified: Secondary | ICD-10-CM | POA: Diagnosis not present

## 2019-04-26 DIAGNOSIS — N183 Chronic kidney disease, stage 3 unspecified: Secondary | ICD-10-CM | POA: Diagnosis not present

## 2019-04-26 DIAGNOSIS — F0390 Unspecified dementia without behavioral disturbance: Secondary | ICD-10-CM | POA: Diagnosis not present

## 2019-04-26 DIAGNOSIS — I739 Peripheral vascular disease, unspecified: Secondary | ICD-10-CM | POA: Diagnosis not present

## 2019-04-26 DIAGNOSIS — I129 Hypertensive chronic kidney disease with stage 1 through stage 4 chronic kidney disease, or unspecified chronic kidney disease: Secondary | ICD-10-CM | POA: Diagnosis not present

## 2019-04-26 DIAGNOSIS — T8781 Dehiscence of amputation stump: Secondary | ICD-10-CM | POA: Diagnosis not present

## 2019-04-26 DIAGNOSIS — E43 Unspecified severe protein-calorie malnutrition: Secondary | ICD-10-CM | POA: Diagnosis not present

## 2019-04-26 DIAGNOSIS — I4891 Unspecified atrial fibrillation: Secondary | ICD-10-CM | POA: Diagnosis not present

## 2019-04-26 DIAGNOSIS — D631 Anemia in chronic kidney disease: Secondary | ICD-10-CM | POA: Diagnosis not present

## 2019-04-28 DIAGNOSIS — D631 Anemia in chronic kidney disease: Secondary | ICD-10-CM | POA: Diagnosis not present

## 2019-04-28 DIAGNOSIS — N183 Chronic kidney disease, stage 3 unspecified: Secondary | ICD-10-CM | POA: Diagnosis not present

## 2019-04-28 DIAGNOSIS — T8781 Dehiscence of amputation stump: Secondary | ICD-10-CM | POA: Diagnosis not present

## 2019-04-28 DIAGNOSIS — I4891 Unspecified atrial fibrillation: Secondary | ICD-10-CM | POA: Diagnosis not present

## 2019-04-28 DIAGNOSIS — E43 Unspecified severe protein-calorie malnutrition: Secondary | ICD-10-CM | POA: Diagnosis not present

## 2019-04-28 DIAGNOSIS — E7800 Pure hypercholesterolemia, unspecified: Secondary | ICD-10-CM | POA: Diagnosis not present

## 2019-04-28 DIAGNOSIS — I739 Peripheral vascular disease, unspecified: Secondary | ICD-10-CM | POA: Diagnosis not present

## 2019-04-28 DIAGNOSIS — F0390 Unspecified dementia without behavioral disturbance: Secondary | ICD-10-CM | POA: Diagnosis not present

## 2019-04-28 DIAGNOSIS — I129 Hypertensive chronic kidney disease with stage 1 through stage 4 chronic kidney disease, or unspecified chronic kidney disease: Secondary | ICD-10-CM | POA: Diagnosis not present

## 2019-05-03 DIAGNOSIS — T8781 Dehiscence of amputation stump: Secondary | ICD-10-CM | POA: Diagnosis not present

## 2019-05-03 DIAGNOSIS — F0390 Unspecified dementia without behavioral disturbance: Secondary | ICD-10-CM | POA: Diagnosis not present

## 2019-05-03 DIAGNOSIS — D631 Anemia in chronic kidney disease: Secondary | ICD-10-CM | POA: Diagnosis not present

## 2019-05-03 DIAGNOSIS — I129 Hypertensive chronic kidney disease with stage 1 through stage 4 chronic kidney disease, or unspecified chronic kidney disease: Secondary | ICD-10-CM | POA: Diagnosis not present

## 2019-05-03 DIAGNOSIS — E7800 Pure hypercholesterolemia, unspecified: Secondary | ICD-10-CM | POA: Diagnosis not present

## 2019-05-03 DIAGNOSIS — N183 Chronic kidney disease, stage 3 unspecified: Secondary | ICD-10-CM | POA: Diagnosis not present

## 2019-05-03 DIAGNOSIS — E43 Unspecified severe protein-calorie malnutrition: Secondary | ICD-10-CM | POA: Diagnosis not present

## 2019-05-03 DIAGNOSIS — I4891 Unspecified atrial fibrillation: Secondary | ICD-10-CM | POA: Diagnosis not present

## 2019-05-03 DIAGNOSIS — I739 Peripheral vascular disease, unspecified: Secondary | ICD-10-CM | POA: Diagnosis not present

## 2019-05-05 DIAGNOSIS — T8781 Dehiscence of amputation stump: Secondary | ICD-10-CM | POA: Diagnosis not present

## 2019-05-05 DIAGNOSIS — E43 Unspecified severe protein-calorie malnutrition: Secondary | ICD-10-CM | POA: Diagnosis not present

## 2019-05-05 DIAGNOSIS — I129 Hypertensive chronic kidney disease with stage 1 through stage 4 chronic kidney disease, or unspecified chronic kidney disease: Secondary | ICD-10-CM | POA: Diagnosis not present

## 2019-05-05 DIAGNOSIS — F0390 Unspecified dementia without behavioral disturbance: Secondary | ICD-10-CM | POA: Diagnosis not present

## 2019-05-05 DIAGNOSIS — I739 Peripheral vascular disease, unspecified: Secondary | ICD-10-CM | POA: Diagnosis not present

## 2019-05-05 DIAGNOSIS — N183 Chronic kidney disease, stage 3 unspecified: Secondary | ICD-10-CM | POA: Diagnosis not present

## 2019-05-05 DIAGNOSIS — E7800 Pure hypercholesterolemia, unspecified: Secondary | ICD-10-CM | POA: Diagnosis not present

## 2019-05-05 DIAGNOSIS — D631 Anemia in chronic kidney disease: Secondary | ICD-10-CM | POA: Diagnosis not present

## 2019-05-05 DIAGNOSIS — I4891 Unspecified atrial fibrillation: Secondary | ICD-10-CM | POA: Diagnosis not present

## 2019-05-09 DIAGNOSIS — F0390 Unspecified dementia without behavioral disturbance: Secondary | ICD-10-CM | POA: Diagnosis not present

## 2019-05-09 DIAGNOSIS — N183 Chronic kidney disease, stage 3 unspecified: Secondary | ICD-10-CM | POA: Diagnosis not present

## 2019-05-09 DIAGNOSIS — E7800 Pure hypercholesterolemia, unspecified: Secondary | ICD-10-CM | POA: Diagnosis not present

## 2019-05-09 DIAGNOSIS — T8781 Dehiscence of amputation stump: Secondary | ICD-10-CM | POA: Diagnosis not present

## 2019-05-09 DIAGNOSIS — I739 Peripheral vascular disease, unspecified: Secondary | ICD-10-CM | POA: Diagnosis not present

## 2019-05-09 DIAGNOSIS — E43 Unspecified severe protein-calorie malnutrition: Secondary | ICD-10-CM | POA: Diagnosis not present

## 2019-05-09 DIAGNOSIS — D631 Anemia in chronic kidney disease: Secondary | ICD-10-CM | POA: Diagnosis not present

## 2019-05-09 DIAGNOSIS — I4891 Unspecified atrial fibrillation: Secondary | ICD-10-CM | POA: Diagnosis not present

## 2019-05-09 DIAGNOSIS — I129 Hypertensive chronic kidney disease with stage 1 through stage 4 chronic kidney disease, or unspecified chronic kidney disease: Secondary | ICD-10-CM | POA: Diagnosis not present

## 2019-05-17 DIAGNOSIS — I739 Peripheral vascular disease, unspecified: Secondary | ICD-10-CM | POA: Diagnosis not present

## 2019-05-17 DIAGNOSIS — I4891 Unspecified atrial fibrillation: Secondary | ICD-10-CM | POA: Diagnosis not present

## 2019-05-17 DIAGNOSIS — F0390 Unspecified dementia without behavioral disturbance: Secondary | ICD-10-CM | POA: Diagnosis not present

## 2019-05-17 DIAGNOSIS — I129 Hypertensive chronic kidney disease with stage 1 through stage 4 chronic kidney disease, or unspecified chronic kidney disease: Secondary | ICD-10-CM | POA: Diagnosis not present

## 2019-05-17 DIAGNOSIS — N183 Chronic kidney disease, stage 3 unspecified: Secondary | ICD-10-CM | POA: Diagnosis not present

## 2019-05-17 DIAGNOSIS — D631 Anemia in chronic kidney disease: Secondary | ICD-10-CM | POA: Diagnosis not present

## 2019-05-17 DIAGNOSIS — E43 Unspecified severe protein-calorie malnutrition: Secondary | ICD-10-CM | POA: Diagnosis not present

## 2019-05-17 DIAGNOSIS — T8781 Dehiscence of amputation stump: Secondary | ICD-10-CM | POA: Diagnosis not present

## 2019-05-17 DIAGNOSIS — E7800 Pure hypercholesterolemia, unspecified: Secondary | ICD-10-CM | POA: Diagnosis not present

## 2019-05-18 DIAGNOSIS — I739 Peripheral vascular disease, unspecified: Secondary | ICD-10-CM | POA: Diagnosis not present

## 2019-05-18 DIAGNOSIS — N183 Chronic kidney disease, stage 3 unspecified: Secondary | ICD-10-CM | POA: Diagnosis not present

## 2019-05-18 DIAGNOSIS — M86671 Other chronic osteomyelitis, right ankle and foot: Secondary | ICD-10-CM | POA: Diagnosis not present

## 2019-05-18 DIAGNOSIS — G629 Polyneuropathy, unspecified: Secondary | ICD-10-CM | POA: Diagnosis not present

## 2019-05-18 DIAGNOSIS — T8130XD Disruption of wound, unspecified, subsequent encounter: Secondary | ICD-10-CM | POA: Diagnosis not present

## 2019-05-20 DIAGNOSIS — E7800 Pure hypercholesterolemia, unspecified: Secondary | ICD-10-CM | POA: Diagnosis not present

## 2019-05-20 DIAGNOSIS — N183 Chronic kidney disease, stage 3 unspecified: Secondary | ICD-10-CM | POA: Diagnosis not present

## 2019-05-20 DIAGNOSIS — T8781 Dehiscence of amputation stump: Secondary | ICD-10-CM | POA: Diagnosis not present

## 2019-05-20 DIAGNOSIS — I129 Hypertensive chronic kidney disease with stage 1 through stage 4 chronic kidney disease, or unspecified chronic kidney disease: Secondary | ICD-10-CM | POA: Diagnosis not present

## 2019-05-20 DIAGNOSIS — D631 Anemia in chronic kidney disease: Secondary | ICD-10-CM | POA: Diagnosis not present

## 2019-05-20 DIAGNOSIS — E43 Unspecified severe protein-calorie malnutrition: Secondary | ICD-10-CM | POA: Diagnosis not present

## 2019-05-20 DIAGNOSIS — F0390 Unspecified dementia without behavioral disturbance: Secondary | ICD-10-CM | POA: Diagnosis not present

## 2019-05-20 DIAGNOSIS — I739 Peripheral vascular disease, unspecified: Secondary | ICD-10-CM | POA: Diagnosis not present

## 2019-05-20 DIAGNOSIS — I4891 Unspecified atrial fibrillation: Secondary | ICD-10-CM | POA: Diagnosis not present

## 2019-05-24 DIAGNOSIS — E7800 Pure hypercholesterolemia, unspecified: Secondary | ICD-10-CM | POA: Diagnosis not present

## 2019-05-24 DIAGNOSIS — I129 Hypertensive chronic kidney disease with stage 1 through stage 4 chronic kidney disease, or unspecified chronic kidney disease: Secondary | ICD-10-CM | POA: Diagnosis not present

## 2019-05-24 DIAGNOSIS — I4891 Unspecified atrial fibrillation: Secondary | ICD-10-CM | POA: Diagnosis not present

## 2019-05-24 DIAGNOSIS — F0390 Unspecified dementia without behavioral disturbance: Secondary | ICD-10-CM | POA: Diagnosis not present

## 2019-05-24 DIAGNOSIS — T8781 Dehiscence of amputation stump: Secondary | ICD-10-CM | POA: Diagnosis not present

## 2019-05-24 DIAGNOSIS — N183 Chronic kidney disease, stage 3 unspecified: Secondary | ICD-10-CM | POA: Diagnosis not present

## 2019-05-24 DIAGNOSIS — I739 Peripheral vascular disease, unspecified: Secondary | ICD-10-CM | POA: Diagnosis not present

## 2019-05-24 DIAGNOSIS — E43 Unspecified severe protein-calorie malnutrition: Secondary | ICD-10-CM | POA: Diagnosis not present

## 2019-05-24 DIAGNOSIS — D631 Anemia in chronic kidney disease: Secondary | ICD-10-CM | POA: Diagnosis not present

## 2019-05-26 DIAGNOSIS — I739 Peripheral vascular disease, unspecified: Secondary | ICD-10-CM | POA: Diagnosis not present

## 2019-05-26 DIAGNOSIS — E7800 Pure hypercholesterolemia, unspecified: Secondary | ICD-10-CM | POA: Diagnosis not present

## 2019-05-26 DIAGNOSIS — D631 Anemia in chronic kidney disease: Secondary | ICD-10-CM | POA: Diagnosis not present

## 2019-05-26 DIAGNOSIS — N183 Chronic kidney disease, stage 3 unspecified: Secondary | ICD-10-CM | POA: Diagnosis not present

## 2019-05-26 DIAGNOSIS — I4891 Unspecified atrial fibrillation: Secondary | ICD-10-CM | POA: Diagnosis not present

## 2019-05-26 DIAGNOSIS — T8781 Dehiscence of amputation stump: Secondary | ICD-10-CM | POA: Diagnosis not present

## 2019-05-26 DIAGNOSIS — E43 Unspecified severe protein-calorie malnutrition: Secondary | ICD-10-CM | POA: Diagnosis not present

## 2019-05-26 DIAGNOSIS — F0390 Unspecified dementia without behavioral disturbance: Secondary | ICD-10-CM | POA: Diagnosis not present

## 2019-05-26 DIAGNOSIS — I129 Hypertensive chronic kidney disease with stage 1 through stage 4 chronic kidney disease, or unspecified chronic kidney disease: Secondary | ICD-10-CM | POA: Diagnosis not present

## 2019-05-27 DIAGNOSIS — Z86718 Personal history of other venous thrombosis and embolism: Secondary | ICD-10-CM | POA: Diagnosis not present

## 2019-05-27 DIAGNOSIS — D696 Thrombocytopenia, unspecified: Secondary | ICD-10-CM | POA: Diagnosis not present

## 2019-05-27 DIAGNOSIS — I714 Abdominal aortic aneurysm, without rupture: Secondary | ICD-10-CM | POA: Diagnosis not present

## 2019-05-27 DIAGNOSIS — E7800 Pure hypercholesterolemia, unspecified: Secondary | ICD-10-CM | POA: Diagnosis not present

## 2019-05-27 DIAGNOSIS — I10 Essential (primary) hypertension: Secondary | ICD-10-CM | POA: Diagnosis not present

## 2019-05-27 DIAGNOSIS — I739 Peripheral vascular disease, unspecified: Secondary | ICD-10-CM | POA: Diagnosis not present

## 2019-05-27 DIAGNOSIS — I4811 Longstanding persistent atrial fibrillation: Secondary | ICD-10-CM | POA: Diagnosis not present

## 2019-05-27 DIAGNOSIS — R972 Elevated prostate specific antigen [PSA]: Secondary | ICD-10-CM | POA: Diagnosis not present

## 2019-05-27 DIAGNOSIS — N183 Chronic kidney disease, stage 3 unspecified: Secondary | ICD-10-CM | POA: Diagnosis not present

## 2019-05-30 DIAGNOSIS — I129 Hypertensive chronic kidney disease with stage 1 through stage 4 chronic kidney disease, or unspecified chronic kidney disease: Secondary | ICD-10-CM | POA: Diagnosis not present

## 2019-05-30 DIAGNOSIS — D631 Anemia in chronic kidney disease: Secondary | ICD-10-CM | POA: Diagnosis not present

## 2019-05-30 DIAGNOSIS — T8781 Dehiscence of amputation stump: Secondary | ICD-10-CM | POA: Diagnosis not present

## 2019-05-30 DIAGNOSIS — E7800 Pure hypercholesterolemia, unspecified: Secondary | ICD-10-CM | POA: Diagnosis not present

## 2019-05-30 DIAGNOSIS — E43 Unspecified severe protein-calorie malnutrition: Secondary | ICD-10-CM | POA: Diagnosis not present

## 2019-05-30 DIAGNOSIS — I739 Peripheral vascular disease, unspecified: Secondary | ICD-10-CM | POA: Diagnosis not present

## 2019-05-30 DIAGNOSIS — I4891 Unspecified atrial fibrillation: Secondary | ICD-10-CM | POA: Diagnosis not present

## 2019-05-30 DIAGNOSIS — F0390 Unspecified dementia without behavioral disturbance: Secondary | ICD-10-CM | POA: Diagnosis not present

## 2019-05-30 DIAGNOSIS — N183 Chronic kidney disease, stage 3 unspecified: Secondary | ICD-10-CM | POA: Diagnosis not present

## 2019-06-02 DIAGNOSIS — I129 Hypertensive chronic kidney disease with stage 1 through stage 4 chronic kidney disease, or unspecified chronic kidney disease: Secondary | ICD-10-CM | POA: Diagnosis not present

## 2019-06-02 DIAGNOSIS — I739 Peripheral vascular disease, unspecified: Secondary | ICD-10-CM | POA: Diagnosis not present

## 2019-06-02 DIAGNOSIS — T8781 Dehiscence of amputation stump: Secondary | ICD-10-CM | POA: Diagnosis not present

## 2019-06-02 DIAGNOSIS — E43 Unspecified severe protein-calorie malnutrition: Secondary | ICD-10-CM | POA: Diagnosis not present

## 2019-06-02 DIAGNOSIS — F0390 Unspecified dementia without behavioral disturbance: Secondary | ICD-10-CM | POA: Diagnosis not present

## 2019-06-02 DIAGNOSIS — D631 Anemia in chronic kidney disease: Secondary | ICD-10-CM | POA: Diagnosis not present

## 2019-06-02 DIAGNOSIS — I4891 Unspecified atrial fibrillation: Secondary | ICD-10-CM | POA: Diagnosis not present

## 2019-06-02 DIAGNOSIS — E7800 Pure hypercholesterolemia, unspecified: Secondary | ICD-10-CM | POA: Diagnosis not present

## 2019-06-02 DIAGNOSIS — N183 Chronic kidney disease, stage 3 unspecified: Secondary | ICD-10-CM | POA: Diagnosis not present

## 2019-06-06 DIAGNOSIS — T8781 Dehiscence of amputation stump: Secondary | ICD-10-CM | POA: Diagnosis not present

## 2019-06-06 DIAGNOSIS — F0390 Unspecified dementia without behavioral disturbance: Secondary | ICD-10-CM | POA: Diagnosis not present

## 2019-06-06 DIAGNOSIS — I4891 Unspecified atrial fibrillation: Secondary | ICD-10-CM | POA: Diagnosis not present

## 2019-06-06 DIAGNOSIS — E43 Unspecified severe protein-calorie malnutrition: Secondary | ICD-10-CM | POA: Diagnosis not present

## 2019-06-06 DIAGNOSIS — E7800 Pure hypercholesterolemia, unspecified: Secondary | ICD-10-CM | POA: Diagnosis not present

## 2019-06-06 DIAGNOSIS — I739 Peripheral vascular disease, unspecified: Secondary | ICD-10-CM | POA: Diagnosis not present

## 2019-06-06 DIAGNOSIS — I129 Hypertensive chronic kidney disease with stage 1 through stage 4 chronic kidney disease, or unspecified chronic kidney disease: Secondary | ICD-10-CM | POA: Diagnosis not present

## 2019-06-06 DIAGNOSIS — N183 Chronic kidney disease, stage 3 unspecified: Secondary | ICD-10-CM | POA: Diagnosis not present

## 2019-06-06 DIAGNOSIS — D631 Anemia in chronic kidney disease: Secondary | ICD-10-CM | POA: Diagnosis not present

## 2019-06-08 DIAGNOSIS — I998 Other disorder of circulatory system: Secondary | ICD-10-CM | POA: Diagnosis not present

## 2019-06-08 DIAGNOSIS — I129 Hypertensive chronic kidney disease with stage 1 through stage 4 chronic kidney disease, or unspecified chronic kidney disease: Secondary | ICD-10-CM | POA: Diagnosis not present

## 2019-06-08 DIAGNOSIS — L97519 Non-pressure chronic ulcer of other part of right foot with unspecified severity: Secondary | ICD-10-CM | POA: Diagnosis not present

## 2019-06-08 DIAGNOSIS — I4811 Longstanding persistent atrial fibrillation: Secondary | ICD-10-CM | POA: Diagnosis not present

## 2019-06-08 DIAGNOSIS — I70235 Atherosclerosis of native arteries of right leg with ulceration of other part of foot: Secondary | ICD-10-CM | POA: Diagnosis not present

## 2019-06-08 DIAGNOSIS — I10 Essential (primary) hypertension: Secondary | ICD-10-CM | POA: Diagnosis not present

## 2019-06-08 DIAGNOSIS — Z4781 Encounter for orthopedic aftercare following surgical amputation: Secondary | ICD-10-CM | POA: Diagnosis not present

## 2019-06-08 DIAGNOSIS — I714 Abdominal aortic aneurysm, without rupture: Secondary | ICD-10-CM | POA: Diagnosis not present

## 2019-06-08 DIAGNOSIS — Z89431 Acquired absence of right foot: Secondary | ICD-10-CM | POA: Diagnosis not present

## 2019-06-08 DIAGNOSIS — I739 Peripheral vascular disease, unspecified: Secondary | ICD-10-CM | POA: Diagnosis not present

## 2019-06-08 DIAGNOSIS — T8130XA Disruption of wound, unspecified, initial encounter: Secondary | ICD-10-CM | POA: Diagnosis not present

## 2019-06-08 DIAGNOSIS — N183 Chronic kidney disease, stage 3 unspecified: Secondary | ICD-10-CM | POA: Diagnosis not present

## 2019-06-10 DIAGNOSIS — I739 Peripheral vascular disease, unspecified: Secondary | ICD-10-CM | POA: Diagnosis not present

## 2019-06-10 DIAGNOSIS — E7800 Pure hypercholesterolemia, unspecified: Secondary | ICD-10-CM | POA: Diagnosis not present

## 2019-06-10 DIAGNOSIS — F0390 Unspecified dementia without behavioral disturbance: Secondary | ICD-10-CM | POA: Diagnosis not present

## 2019-06-10 DIAGNOSIS — D631 Anemia in chronic kidney disease: Secondary | ICD-10-CM | POA: Diagnosis not present

## 2019-06-10 DIAGNOSIS — T8781 Dehiscence of amputation stump: Secondary | ICD-10-CM | POA: Diagnosis not present

## 2019-06-10 DIAGNOSIS — I4891 Unspecified atrial fibrillation: Secondary | ICD-10-CM | POA: Diagnosis not present

## 2019-06-10 DIAGNOSIS — E43 Unspecified severe protein-calorie malnutrition: Secondary | ICD-10-CM | POA: Diagnosis not present

## 2019-06-10 DIAGNOSIS — I129 Hypertensive chronic kidney disease with stage 1 through stage 4 chronic kidney disease, or unspecified chronic kidney disease: Secondary | ICD-10-CM | POA: Diagnosis not present

## 2019-06-10 DIAGNOSIS — N183 Chronic kidney disease, stage 3 unspecified: Secondary | ICD-10-CM | POA: Diagnosis not present

## 2019-06-14 DIAGNOSIS — R634 Abnormal weight loss: Secondary | ICD-10-CM | POA: Diagnosis not present

## 2019-06-14 DIAGNOSIS — I739 Peripheral vascular disease, unspecified: Secondary | ICD-10-CM | POA: Diagnosis not present

## 2019-06-14 DIAGNOSIS — T8130XD Disruption of wound, unspecified, subsequent encounter: Secondary | ICD-10-CM | POA: Diagnosis not present

## 2019-06-14 DIAGNOSIS — D631 Anemia in chronic kidney disease: Secondary | ICD-10-CM | POA: Diagnosis not present

## 2019-06-14 DIAGNOSIS — F0390 Unspecified dementia without behavioral disturbance: Secondary | ICD-10-CM | POA: Diagnosis not present

## 2019-06-14 DIAGNOSIS — L089 Local infection of the skin and subcutaneous tissue, unspecified: Secondary | ICD-10-CM | POA: Diagnosis not present

## 2019-06-14 DIAGNOSIS — I4891 Unspecified atrial fibrillation: Secondary | ICD-10-CM | POA: Diagnosis not present

## 2019-06-14 DIAGNOSIS — N183 Chronic kidney disease, stage 3 unspecified: Secondary | ICD-10-CM | POA: Diagnosis not present

## 2019-06-14 DIAGNOSIS — E7800 Pure hypercholesterolemia, unspecified: Secondary | ICD-10-CM | POA: Diagnosis not present

## 2019-06-14 DIAGNOSIS — E43 Unspecified severe protein-calorie malnutrition: Secondary | ICD-10-CM | POA: Diagnosis not present

## 2019-06-14 DIAGNOSIS — Z89431 Acquired absence of right foot: Secondary | ICD-10-CM | POA: Diagnosis not present

## 2019-06-14 DIAGNOSIS — I129 Hypertensive chronic kidney disease with stage 1 through stage 4 chronic kidney disease, or unspecified chronic kidney disease: Secondary | ICD-10-CM | POA: Diagnosis not present

## 2019-06-14 DIAGNOSIS — T8781 Dehiscence of amputation stump: Secondary | ICD-10-CM | POA: Diagnosis not present

## 2019-06-17 DIAGNOSIS — E43 Unspecified severe protein-calorie malnutrition: Secondary | ICD-10-CM | POA: Diagnosis not present

## 2019-06-17 DIAGNOSIS — I4891 Unspecified atrial fibrillation: Secondary | ICD-10-CM | POA: Diagnosis not present

## 2019-06-17 DIAGNOSIS — I129 Hypertensive chronic kidney disease with stage 1 through stage 4 chronic kidney disease, or unspecified chronic kidney disease: Secondary | ICD-10-CM | POA: Diagnosis not present

## 2019-06-17 DIAGNOSIS — N183 Chronic kidney disease, stage 3 unspecified: Secondary | ICD-10-CM | POA: Diagnosis not present

## 2019-06-17 DIAGNOSIS — E7800 Pure hypercholesterolemia, unspecified: Secondary | ICD-10-CM | POA: Diagnosis not present

## 2019-06-17 DIAGNOSIS — D631 Anemia in chronic kidney disease: Secondary | ICD-10-CM | POA: Diagnosis not present

## 2019-06-17 DIAGNOSIS — I739 Peripheral vascular disease, unspecified: Secondary | ICD-10-CM | POA: Diagnosis not present

## 2019-06-17 DIAGNOSIS — F0390 Unspecified dementia without behavioral disturbance: Secondary | ICD-10-CM | POA: Diagnosis not present

## 2019-06-17 DIAGNOSIS — T8781 Dehiscence of amputation stump: Secondary | ICD-10-CM | POA: Diagnosis not present

## 2019-06-23 DIAGNOSIS — I739 Peripheral vascular disease, unspecified: Secondary | ICD-10-CM | POA: Diagnosis not present

## 2019-06-23 DIAGNOSIS — N183 Chronic kidney disease, stage 3 unspecified: Secondary | ICD-10-CM | POA: Diagnosis not present

## 2019-06-23 DIAGNOSIS — T8781 Dehiscence of amputation stump: Secondary | ICD-10-CM | POA: Diagnosis not present

## 2019-06-23 DIAGNOSIS — E7800 Pure hypercholesterolemia, unspecified: Secondary | ICD-10-CM | POA: Diagnosis not present

## 2019-06-23 DIAGNOSIS — D631 Anemia in chronic kidney disease: Secondary | ICD-10-CM | POA: Diagnosis not present

## 2019-06-23 DIAGNOSIS — F0390 Unspecified dementia without behavioral disturbance: Secondary | ICD-10-CM | POA: Diagnosis not present

## 2019-06-23 DIAGNOSIS — E43 Unspecified severe protein-calorie malnutrition: Secondary | ICD-10-CM | POA: Diagnosis not present

## 2019-06-23 DIAGNOSIS — I129 Hypertensive chronic kidney disease with stage 1 through stage 4 chronic kidney disease, or unspecified chronic kidney disease: Secondary | ICD-10-CM | POA: Diagnosis not present

## 2019-06-23 DIAGNOSIS — I4891 Unspecified atrial fibrillation: Secondary | ICD-10-CM | POA: Diagnosis not present

## 2019-06-27 DIAGNOSIS — E7800 Pure hypercholesterolemia, unspecified: Secondary | ICD-10-CM | POA: Diagnosis not present

## 2019-06-27 DIAGNOSIS — D631 Anemia in chronic kidney disease: Secondary | ICD-10-CM | POA: Diagnosis not present

## 2019-06-27 DIAGNOSIS — I4891 Unspecified atrial fibrillation: Secondary | ICD-10-CM | POA: Diagnosis not present

## 2019-06-27 DIAGNOSIS — F0390 Unspecified dementia without behavioral disturbance: Secondary | ICD-10-CM | POA: Diagnosis not present

## 2019-06-27 DIAGNOSIS — T8781 Dehiscence of amputation stump: Secondary | ICD-10-CM | POA: Diagnosis not present

## 2019-06-27 DIAGNOSIS — N183 Chronic kidney disease, stage 3 unspecified: Secondary | ICD-10-CM | POA: Diagnosis not present

## 2019-06-27 DIAGNOSIS — E43 Unspecified severe protein-calorie malnutrition: Secondary | ICD-10-CM | POA: Diagnosis not present

## 2019-06-27 DIAGNOSIS — I129 Hypertensive chronic kidney disease with stage 1 through stage 4 chronic kidney disease, or unspecified chronic kidney disease: Secondary | ICD-10-CM | POA: Diagnosis not present

## 2019-06-27 DIAGNOSIS — I739 Peripheral vascular disease, unspecified: Secondary | ICD-10-CM | POA: Diagnosis not present

## 2019-06-30 DIAGNOSIS — T8781 Dehiscence of amputation stump: Secondary | ICD-10-CM | POA: Diagnosis not present

## 2019-06-30 DIAGNOSIS — E43 Unspecified severe protein-calorie malnutrition: Secondary | ICD-10-CM | POA: Diagnosis not present

## 2019-06-30 DIAGNOSIS — D631 Anemia in chronic kidney disease: Secondary | ICD-10-CM | POA: Diagnosis not present

## 2019-06-30 DIAGNOSIS — I739 Peripheral vascular disease, unspecified: Secondary | ICD-10-CM | POA: Diagnosis not present

## 2019-06-30 DIAGNOSIS — E7800 Pure hypercholesterolemia, unspecified: Secondary | ICD-10-CM | POA: Diagnosis not present

## 2019-06-30 DIAGNOSIS — I4891 Unspecified atrial fibrillation: Secondary | ICD-10-CM | POA: Diagnosis not present

## 2019-06-30 DIAGNOSIS — I129 Hypertensive chronic kidney disease with stage 1 through stage 4 chronic kidney disease, or unspecified chronic kidney disease: Secondary | ICD-10-CM | POA: Diagnosis not present

## 2019-06-30 DIAGNOSIS — N183 Chronic kidney disease, stage 3 unspecified: Secondary | ICD-10-CM | POA: Diagnosis not present

## 2019-06-30 DIAGNOSIS — F0390 Unspecified dementia without behavioral disturbance: Secondary | ICD-10-CM | POA: Diagnosis not present

## 2019-07-04 DIAGNOSIS — F0390 Unspecified dementia without behavioral disturbance: Secondary | ICD-10-CM | POA: Diagnosis not present

## 2019-07-04 DIAGNOSIS — N183 Chronic kidney disease, stage 3 unspecified: Secondary | ICD-10-CM | POA: Diagnosis not present

## 2019-07-04 DIAGNOSIS — D631 Anemia in chronic kidney disease: Secondary | ICD-10-CM | POA: Diagnosis not present

## 2019-07-04 DIAGNOSIS — E43 Unspecified severe protein-calorie malnutrition: Secondary | ICD-10-CM | POA: Diagnosis not present

## 2019-07-04 DIAGNOSIS — E7800 Pure hypercholesterolemia, unspecified: Secondary | ICD-10-CM | POA: Diagnosis not present

## 2019-07-04 DIAGNOSIS — T8781 Dehiscence of amputation stump: Secondary | ICD-10-CM | POA: Diagnosis not present

## 2019-07-04 DIAGNOSIS — I129 Hypertensive chronic kidney disease with stage 1 through stage 4 chronic kidney disease, or unspecified chronic kidney disease: Secondary | ICD-10-CM | POA: Diagnosis not present

## 2019-07-04 DIAGNOSIS — I4891 Unspecified atrial fibrillation: Secondary | ICD-10-CM | POA: Diagnosis not present

## 2019-07-04 DIAGNOSIS — I739 Peripheral vascular disease, unspecified: Secondary | ICD-10-CM | POA: Diagnosis not present

## 2019-07-06 DIAGNOSIS — T8781 Dehiscence of amputation stump: Secondary | ICD-10-CM | POA: Diagnosis not present

## 2019-07-06 DIAGNOSIS — E7800 Pure hypercholesterolemia, unspecified: Secondary | ICD-10-CM | POA: Diagnosis not present

## 2019-07-06 DIAGNOSIS — D631 Anemia in chronic kidney disease: Secondary | ICD-10-CM | POA: Diagnosis not present

## 2019-07-06 DIAGNOSIS — N183 Chronic kidney disease, stage 3 unspecified: Secondary | ICD-10-CM | POA: Diagnosis not present

## 2019-07-06 DIAGNOSIS — I4891 Unspecified atrial fibrillation: Secondary | ICD-10-CM | POA: Diagnosis not present

## 2019-07-06 DIAGNOSIS — F0390 Unspecified dementia without behavioral disturbance: Secondary | ICD-10-CM | POA: Diagnosis not present

## 2019-07-06 DIAGNOSIS — I739 Peripheral vascular disease, unspecified: Secondary | ICD-10-CM | POA: Diagnosis not present

## 2019-07-06 DIAGNOSIS — I129 Hypertensive chronic kidney disease with stage 1 through stage 4 chronic kidney disease, or unspecified chronic kidney disease: Secondary | ICD-10-CM | POA: Diagnosis not present

## 2019-07-06 DIAGNOSIS — E43 Unspecified severe protein-calorie malnutrition: Secondary | ICD-10-CM | POA: Diagnosis not present

## 2019-07-11 DIAGNOSIS — E7800 Pure hypercholesterolemia, unspecified: Secondary | ICD-10-CM | POA: Diagnosis not present

## 2019-07-11 DIAGNOSIS — I4891 Unspecified atrial fibrillation: Secondary | ICD-10-CM | POA: Diagnosis not present

## 2019-07-11 DIAGNOSIS — N183 Chronic kidney disease, stage 3 unspecified: Secondary | ICD-10-CM | POA: Diagnosis not present

## 2019-07-11 DIAGNOSIS — F0390 Unspecified dementia without behavioral disturbance: Secondary | ICD-10-CM | POA: Diagnosis not present

## 2019-07-11 DIAGNOSIS — I739 Peripheral vascular disease, unspecified: Secondary | ICD-10-CM | POA: Diagnosis not present

## 2019-07-11 DIAGNOSIS — D631 Anemia in chronic kidney disease: Secondary | ICD-10-CM | POA: Diagnosis not present

## 2019-07-11 DIAGNOSIS — E43 Unspecified severe protein-calorie malnutrition: Secondary | ICD-10-CM | POA: Diagnosis not present

## 2019-07-11 DIAGNOSIS — T8781 Dehiscence of amputation stump: Secondary | ICD-10-CM | POA: Diagnosis not present

## 2019-07-11 DIAGNOSIS — I129 Hypertensive chronic kidney disease with stage 1 through stage 4 chronic kidney disease, or unspecified chronic kidney disease: Secondary | ICD-10-CM | POA: Diagnosis not present

## 2019-07-18 DIAGNOSIS — T8781 Dehiscence of amputation stump: Secondary | ICD-10-CM | POA: Diagnosis not present

## 2019-07-18 DIAGNOSIS — I4891 Unspecified atrial fibrillation: Secondary | ICD-10-CM | POA: Diagnosis not present

## 2019-07-18 DIAGNOSIS — F0390 Unspecified dementia without behavioral disturbance: Secondary | ICD-10-CM | POA: Diagnosis not present

## 2019-07-18 DIAGNOSIS — E43 Unspecified severe protein-calorie malnutrition: Secondary | ICD-10-CM | POA: Diagnosis not present

## 2019-07-18 DIAGNOSIS — I129 Hypertensive chronic kidney disease with stage 1 through stage 4 chronic kidney disease, or unspecified chronic kidney disease: Secondary | ICD-10-CM | POA: Diagnosis not present

## 2019-07-18 DIAGNOSIS — I739 Peripheral vascular disease, unspecified: Secondary | ICD-10-CM | POA: Diagnosis not present

## 2019-07-18 DIAGNOSIS — E7800 Pure hypercholesterolemia, unspecified: Secondary | ICD-10-CM | POA: Diagnosis not present

## 2019-07-18 DIAGNOSIS — N183 Chronic kidney disease, stage 3 unspecified: Secondary | ICD-10-CM | POA: Diagnosis not present

## 2019-07-18 DIAGNOSIS — D631 Anemia in chronic kidney disease: Secondary | ICD-10-CM | POA: Diagnosis not present

## 2019-07-28 DIAGNOSIS — I739 Peripheral vascular disease, unspecified: Secondary | ICD-10-CM | POA: Diagnosis not present

## 2019-07-28 DIAGNOSIS — E43 Unspecified severe protein-calorie malnutrition: Secondary | ICD-10-CM | POA: Diagnosis not present

## 2019-07-28 DIAGNOSIS — I4891 Unspecified atrial fibrillation: Secondary | ICD-10-CM | POA: Diagnosis not present

## 2019-07-28 DIAGNOSIS — E7800 Pure hypercholesterolemia, unspecified: Secondary | ICD-10-CM | POA: Diagnosis not present

## 2019-07-28 DIAGNOSIS — T8781 Dehiscence of amputation stump: Secondary | ICD-10-CM | POA: Diagnosis not present

## 2019-07-28 DIAGNOSIS — F0390 Unspecified dementia without behavioral disturbance: Secondary | ICD-10-CM | POA: Diagnosis not present

## 2019-07-28 DIAGNOSIS — D631 Anemia in chronic kidney disease: Secondary | ICD-10-CM | POA: Diagnosis not present

## 2019-07-28 DIAGNOSIS — N183 Chronic kidney disease, stage 3 unspecified: Secondary | ICD-10-CM | POA: Diagnosis not present

## 2019-07-28 DIAGNOSIS — I129 Hypertensive chronic kidney disease with stage 1 through stage 4 chronic kidney disease, or unspecified chronic kidney disease: Secondary | ICD-10-CM | POA: Diagnosis not present

## 2019-08-03 DIAGNOSIS — F0390 Unspecified dementia without behavioral disturbance: Secondary | ICD-10-CM | POA: Diagnosis not present

## 2019-08-03 DIAGNOSIS — T8781 Dehiscence of amputation stump: Secondary | ICD-10-CM | POA: Diagnosis not present

## 2019-08-03 DIAGNOSIS — I4891 Unspecified atrial fibrillation: Secondary | ICD-10-CM | POA: Diagnosis not present

## 2019-08-03 DIAGNOSIS — I129 Hypertensive chronic kidney disease with stage 1 through stage 4 chronic kidney disease, or unspecified chronic kidney disease: Secondary | ICD-10-CM | POA: Diagnosis not present

## 2019-08-03 DIAGNOSIS — E43 Unspecified severe protein-calorie malnutrition: Secondary | ICD-10-CM | POA: Diagnosis not present

## 2019-08-03 DIAGNOSIS — I739 Peripheral vascular disease, unspecified: Secondary | ICD-10-CM | POA: Diagnosis not present

## 2019-08-03 DIAGNOSIS — D631 Anemia in chronic kidney disease: Secondary | ICD-10-CM | POA: Diagnosis not present

## 2019-08-03 DIAGNOSIS — E7800 Pure hypercholesterolemia, unspecified: Secondary | ICD-10-CM | POA: Diagnosis not present

## 2019-08-03 DIAGNOSIS — N183 Chronic kidney disease, stage 3 unspecified: Secondary | ICD-10-CM | POA: Diagnosis not present

## 2019-08-08 DIAGNOSIS — I4891 Unspecified atrial fibrillation: Secondary | ICD-10-CM | POA: Diagnosis not present

## 2019-08-08 DIAGNOSIS — N183 Chronic kidney disease, stage 3 unspecified: Secondary | ICD-10-CM | POA: Diagnosis not present

## 2019-08-08 DIAGNOSIS — F0390 Unspecified dementia without behavioral disturbance: Secondary | ICD-10-CM | POA: Diagnosis not present

## 2019-08-08 DIAGNOSIS — T8781 Dehiscence of amputation stump: Secondary | ICD-10-CM | POA: Diagnosis not present

## 2019-08-08 DIAGNOSIS — I739 Peripheral vascular disease, unspecified: Secondary | ICD-10-CM | POA: Diagnosis not present

## 2019-08-08 DIAGNOSIS — E43 Unspecified severe protein-calorie malnutrition: Secondary | ICD-10-CM | POA: Diagnosis not present

## 2019-08-08 DIAGNOSIS — E7800 Pure hypercholesterolemia, unspecified: Secondary | ICD-10-CM | POA: Diagnosis not present

## 2019-08-08 DIAGNOSIS — I129 Hypertensive chronic kidney disease with stage 1 through stage 4 chronic kidney disease, or unspecified chronic kidney disease: Secondary | ICD-10-CM | POA: Diagnosis not present

## 2019-08-08 DIAGNOSIS — D631 Anemia in chronic kidney disease: Secondary | ICD-10-CM | POA: Diagnosis not present

## 2019-08-15 DIAGNOSIS — I4891 Unspecified atrial fibrillation: Secondary | ICD-10-CM | POA: Diagnosis not present

## 2019-08-15 DIAGNOSIS — I129 Hypertensive chronic kidney disease with stage 1 through stage 4 chronic kidney disease, or unspecified chronic kidney disease: Secondary | ICD-10-CM | POA: Diagnosis not present

## 2019-08-15 DIAGNOSIS — F0390 Unspecified dementia without behavioral disturbance: Secondary | ICD-10-CM | POA: Diagnosis not present

## 2019-08-15 DIAGNOSIS — E43 Unspecified severe protein-calorie malnutrition: Secondary | ICD-10-CM | POA: Diagnosis not present

## 2019-08-15 DIAGNOSIS — T8781 Dehiscence of amputation stump: Secondary | ICD-10-CM | POA: Diagnosis not present

## 2019-08-15 DIAGNOSIS — I739 Peripheral vascular disease, unspecified: Secondary | ICD-10-CM | POA: Diagnosis not present

## 2019-08-15 DIAGNOSIS — N183 Chronic kidney disease, stage 3 unspecified: Secondary | ICD-10-CM | POA: Diagnosis not present

## 2019-08-15 DIAGNOSIS — E7800 Pure hypercholesterolemia, unspecified: Secondary | ICD-10-CM | POA: Diagnosis not present

## 2019-08-15 DIAGNOSIS — D631 Anemia in chronic kidney disease: Secondary | ICD-10-CM | POA: Diagnosis not present

## 2019-08-25 DIAGNOSIS — I129 Hypertensive chronic kidney disease with stage 1 through stage 4 chronic kidney disease, or unspecified chronic kidney disease: Secondary | ICD-10-CM | POA: Diagnosis not present

## 2019-08-25 DIAGNOSIS — N183 Chronic kidney disease, stage 3 unspecified: Secondary | ICD-10-CM | POA: Diagnosis not present

## 2019-08-25 DIAGNOSIS — F0390 Unspecified dementia without behavioral disturbance: Secondary | ICD-10-CM | POA: Diagnosis not present

## 2019-08-25 DIAGNOSIS — E43 Unspecified severe protein-calorie malnutrition: Secondary | ICD-10-CM | POA: Diagnosis not present

## 2019-08-25 DIAGNOSIS — E7800 Pure hypercholesterolemia, unspecified: Secondary | ICD-10-CM | POA: Diagnosis not present

## 2019-08-25 DIAGNOSIS — I4891 Unspecified atrial fibrillation: Secondary | ICD-10-CM | POA: Diagnosis not present

## 2019-08-25 DIAGNOSIS — I739 Peripheral vascular disease, unspecified: Secondary | ICD-10-CM | POA: Diagnosis not present

## 2019-08-25 DIAGNOSIS — D631 Anemia in chronic kidney disease: Secondary | ICD-10-CM | POA: Diagnosis not present

## 2019-08-25 DIAGNOSIS — T8781 Dehiscence of amputation stump: Secondary | ICD-10-CM | POA: Diagnosis not present

## 2019-08-30 DIAGNOSIS — I739 Peripheral vascular disease, unspecified: Secondary | ICD-10-CM | POA: Diagnosis not present

## 2019-08-30 DIAGNOSIS — T8781 Dehiscence of amputation stump: Secondary | ICD-10-CM | POA: Diagnosis not present

## 2019-08-30 DIAGNOSIS — I129 Hypertensive chronic kidney disease with stage 1 through stage 4 chronic kidney disease, or unspecified chronic kidney disease: Secondary | ICD-10-CM | POA: Diagnosis not present

## 2019-08-30 DIAGNOSIS — E7800 Pure hypercholesterolemia, unspecified: Secondary | ICD-10-CM | POA: Diagnosis not present

## 2019-08-30 DIAGNOSIS — N183 Chronic kidney disease, stage 3 unspecified: Secondary | ICD-10-CM | POA: Diagnosis not present

## 2019-08-30 DIAGNOSIS — F0390 Unspecified dementia without behavioral disturbance: Secondary | ICD-10-CM | POA: Diagnosis not present

## 2019-08-30 DIAGNOSIS — E43 Unspecified severe protein-calorie malnutrition: Secondary | ICD-10-CM | POA: Diagnosis not present

## 2019-08-30 DIAGNOSIS — D631 Anemia in chronic kidney disease: Secondary | ICD-10-CM | POA: Diagnosis not present

## 2019-08-30 DIAGNOSIS — I4891 Unspecified atrial fibrillation: Secondary | ICD-10-CM | POA: Diagnosis not present

## 2019-09-08 DIAGNOSIS — M6281 Muscle weakness (generalized): Secondary | ICD-10-CM | POA: Diagnosis not present

## 2019-09-12 DIAGNOSIS — D631 Anemia in chronic kidney disease: Secondary | ICD-10-CM | POA: Diagnosis not present

## 2019-09-12 DIAGNOSIS — N183 Chronic kidney disease, stage 3 unspecified: Secondary | ICD-10-CM | POA: Diagnosis not present

## 2019-09-12 DIAGNOSIS — I4891 Unspecified atrial fibrillation: Secondary | ICD-10-CM | POA: Diagnosis not present

## 2019-09-12 DIAGNOSIS — E7800 Pure hypercholesterolemia, unspecified: Secondary | ICD-10-CM | POA: Diagnosis not present

## 2019-09-12 DIAGNOSIS — I739 Peripheral vascular disease, unspecified: Secondary | ICD-10-CM | POA: Diagnosis not present

## 2019-09-12 DIAGNOSIS — I129 Hypertensive chronic kidney disease with stage 1 through stage 4 chronic kidney disease, or unspecified chronic kidney disease: Secondary | ICD-10-CM | POA: Diagnosis not present

## 2019-09-12 DIAGNOSIS — T8781 Dehiscence of amputation stump: Secondary | ICD-10-CM | POA: Diagnosis not present

## 2019-09-12 DIAGNOSIS — F0390 Unspecified dementia without behavioral disturbance: Secondary | ICD-10-CM | POA: Diagnosis not present

## 2019-09-12 DIAGNOSIS — E43 Unspecified severe protein-calorie malnutrition: Secondary | ICD-10-CM | POA: Diagnosis not present

## 2019-09-19 DIAGNOSIS — E7800 Pure hypercholesterolemia, unspecified: Secondary | ICD-10-CM | POA: Diagnosis not present

## 2019-09-19 DIAGNOSIS — E43 Unspecified severe protein-calorie malnutrition: Secondary | ICD-10-CM | POA: Diagnosis not present

## 2019-09-19 DIAGNOSIS — I129 Hypertensive chronic kidney disease with stage 1 through stage 4 chronic kidney disease, or unspecified chronic kidney disease: Secondary | ICD-10-CM | POA: Diagnosis not present

## 2019-09-19 DIAGNOSIS — N183 Chronic kidney disease, stage 3 unspecified: Secondary | ICD-10-CM | POA: Diagnosis not present

## 2019-09-19 DIAGNOSIS — F0390 Unspecified dementia without behavioral disturbance: Secondary | ICD-10-CM | POA: Diagnosis not present

## 2019-09-19 DIAGNOSIS — D631 Anemia in chronic kidney disease: Secondary | ICD-10-CM | POA: Diagnosis not present

## 2019-09-19 DIAGNOSIS — T8781 Dehiscence of amputation stump: Secondary | ICD-10-CM | POA: Diagnosis not present

## 2019-09-19 DIAGNOSIS — I739 Peripheral vascular disease, unspecified: Secondary | ICD-10-CM | POA: Diagnosis not present

## 2019-09-19 DIAGNOSIS — I4891 Unspecified atrial fibrillation: Secondary | ICD-10-CM | POA: Diagnosis not present

## 2019-09-20 DIAGNOSIS — F0390 Unspecified dementia without behavioral disturbance: Secondary | ICD-10-CM | POA: Diagnosis not present

## 2019-09-20 DIAGNOSIS — Z89431 Acquired absence of right foot: Secondary | ICD-10-CM | POA: Diagnosis not present

## 2019-09-20 DIAGNOSIS — E7800 Pure hypercholesterolemia, unspecified: Secondary | ICD-10-CM | POA: Diagnosis not present

## 2019-09-20 DIAGNOSIS — Z0000 Encounter for general adult medical examination without abnormal findings: Secondary | ICD-10-CM | POA: Diagnosis not present

## 2019-09-20 DIAGNOSIS — I10 Essential (primary) hypertension: Secondary | ICD-10-CM | POA: Diagnosis not present

## 2019-09-20 DIAGNOSIS — E43 Unspecified severe protein-calorie malnutrition: Secondary | ICD-10-CM | POA: Diagnosis not present

## 2019-09-20 DIAGNOSIS — R634 Abnormal weight loss: Secondary | ICD-10-CM | POA: Diagnosis not present

## 2019-09-22 DIAGNOSIS — L97519 Non-pressure chronic ulcer of other part of right foot with unspecified severity: Secondary | ICD-10-CM | POA: Diagnosis not present

## 2019-09-22 DIAGNOSIS — L97509 Non-pressure chronic ulcer of other part of unspecified foot with unspecified severity: Secondary | ICD-10-CM | POA: Diagnosis not present

## 2019-09-22 DIAGNOSIS — I70241 Atherosclerosis of native arteries of left leg with ulceration of thigh: Secondary | ICD-10-CM | POA: Diagnosis not present

## 2019-09-22 DIAGNOSIS — L97129 Non-pressure chronic ulcer of left thigh with unspecified severity: Secondary | ICD-10-CM | POA: Diagnosis not present

## 2019-09-22 DIAGNOSIS — I70245 Atherosclerosis of native arteries of left leg with ulceration of other part of foot: Secondary | ICD-10-CM | POA: Diagnosis not present

## 2019-09-22 DIAGNOSIS — N183 Chronic kidney disease, stage 3 unspecified: Secondary | ICD-10-CM | POA: Diagnosis not present

## 2019-09-22 DIAGNOSIS — I7025 Atherosclerosis of native arteries of other extremities with ulceration: Secondary | ICD-10-CM | POA: Diagnosis not present

## 2019-09-26 DIAGNOSIS — N183 Chronic kidney disease, stage 3 unspecified: Secondary | ICD-10-CM | POA: Diagnosis not present

## 2019-09-26 DIAGNOSIS — T8781 Dehiscence of amputation stump: Secondary | ICD-10-CM | POA: Diagnosis not present

## 2019-09-26 DIAGNOSIS — I129 Hypertensive chronic kidney disease with stage 1 through stage 4 chronic kidney disease, or unspecified chronic kidney disease: Secondary | ICD-10-CM | POA: Diagnosis not present

## 2019-09-26 DIAGNOSIS — E7800 Pure hypercholesterolemia, unspecified: Secondary | ICD-10-CM | POA: Diagnosis not present

## 2019-09-26 DIAGNOSIS — E43 Unspecified severe protein-calorie malnutrition: Secondary | ICD-10-CM | POA: Diagnosis not present

## 2019-09-26 DIAGNOSIS — I4891 Unspecified atrial fibrillation: Secondary | ICD-10-CM | POA: Diagnosis not present

## 2019-09-26 DIAGNOSIS — I739 Peripheral vascular disease, unspecified: Secondary | ICD-10-CM | POA: Diagnosis not present

## 2019-09-26 DIAGNOSIS — F0390 Unspecified dementia without behavioral disturbance: Secondary | ICD-10-CM | POA: Diagnosis not present

## 2019-09-26 DIAGNOSIS — D631 Anemia in chronic kidney disease: Secondary | ICD-10-CM | POA: Diagnosis not present

## 2019-10-03 DIAGNOSIS — T8781 Dehiscence of amputation stump: Secondary | ICD-10-CM | POA: Diagnosis not present

## 2019-10-03 DIAGNOSIS — E7800 Pure hypercholesterolemia, unspecified: Secondary | ICD-10-CM | POA: Diagnosis not present

## 2019-10-03 DIAGNOSIS — I4891 Unspecified atrial fibrillation: Secondary | ICD-10-CM | POA: Diagnosis not present

## 2019-10-03 DIAGNOSIS — D631 Anemia in chronic kidney disease: Secondary | ICD-10-CM | POA: Diagnosis not present

## 2019-10-03 DIAGNOSIS — F0390 Unspecified dementia without behavioral disturbance: Secondary | ICD-10-CM | POA: Diagnosis not present

## 2019-10-03 DIAGNOSIS — I739 Peripheral vascular disease, unspecified: Secondary | ICD-10-CM | POA: Diagnosis not present

## 2019-10-03 DIAGNOSIS — I129 Hypertensive chronic kidney disease with stage 1 through stage 4 chronic kidney disease, or unspecified chronic kidney disease: Secondary | ICD-10-CM | POA: Diagnosis not present

## 2019-10-03 DIAGNOSIS — N183 Chronic kidney disease, stage 3 unspecified: Secondary | ICD-10-CM | POA: Diagnosis not present

## 2019-10-03 DIAGNOSIS — E43 Unspecified severe protein-calorie malnutrition: Secondary | ICD-10-CM | POA: Diagnosis not present

## 2019-10-09 DIAGNOSIS — M6281 Muscle weakness (generalized): Secondary | ICD-10-CM | POA: Diagnosis not present

## 2019-10-10 DIAGNOSIS — I739 Peripheral vascular disease, unspecified: Secondary | ICD-10-CM | POA: Diagnosis not present

## 2019-10-10 DIAGNOSIS — T8781 Dehiscence of amputation stump: Secondary | ICD-10-CM | POA: Diagnosis not present

## 2019-10-10 DIAGNOSIS — E43 Unspecified severe protein-calorie malnutrition: Secondary | ICD-10-CM | POA: Diagnosis not present

## 2019-10-10 DIAGNOSIS — I4891 Unspecified atrial fibrillation: Secondary | ICD-10-CM | POA: Diagnosis not present

## 2019-10-10 DIAGNOSIS — I129 Hypertensive chronic kidney disease with stage 1 through stage 4 chronic kidney disease, or unspecified chronic kidney disease: Secondary | ICD-10-CM | POA: Diagnosis not present

## 2019-10-10 DIAGNOSIS — E7800 Pure hypercholesterolemia, unspecified: Secondary | ICD-10-CM | POA: Diagnosis not present

## 2019-10-10 DIAGNOSIS — D631 Anemia in chronic kidney disease: Secondary | ICD-10-CM | POA: Diagnosis not present

## 2019-10-10 DIAGNOSIS — F0390 Unspecified dementia without behavioral disturbance: Secondary | ICD-10-CM | POA: Diagnosis not present

## 2019-10-10 DIAGNOSIS — N183 Chronic kidney disease, stage 3 unspecified: Secondary | ICD-10-CM | POA: Diagnosis not present

## 2019-10-11 DIAGNOSIS — I129 Hypertensive chronic kidney disease with stage 1 through stage 4 chronic kidney disease, or unspecified chronic kidney disease: Secondary | ICD-10-CM | POA: Diagnosis not present

## 2019-10-11 DIAGNOSIS — D631 Anemia in chronic kidney disease: Secondary | ICD-10-CM | POA: Diagnosis not present

## 2019-10-11 DIAGNOSIS — T8781 Dehiscence of amputation stump: Secondary | ICD-10-CM | POA: Diagnosis not present

## 2019-10-11 DIAGNOSIS — E7800 Pure hypercholesterolemia, unspecified: Secondary | ICD-10-CM | POA: Diagnosis not present

## 2019-10-11 DIAGNOSIS — I4891 Unspecified atrial fibrillation: Secondary | ICD-10-CM | POA: Diagnosis not present

## 2019-10-11 DIAGNOSIS — E43 Unspecified severe protein-calorie malnutrition: Secondary | ICD-10-CM | POA: Diagnosis not present

## 2019-10-11 DIAGNOSIS — I739 Peripheral vascular disease, unspecified: Secondary | ICD-10-CM | POA: Diagnosis not present

## 2019-10-11 DIAGNOSIS — N183 Chronic kidney disease, stage 3 unspecified: Secondary | ICD-10-CM | POA: Diagnosis not present

## 2019-10-11 DIAGNOSIS — F0390 Unspecified dementia without behavioral disturbance: Secondary | ICD-10-CM | POA: Diagnosis not present

## 2019-10-12 DIAGNOSIS — I129 Hypertensive chronic kidney disease with stage 1 through stage 4 chronic kidney disease, or unspecified chronic kidney disease: Secondary | ICD-10-CM | POA: Diagnosis not present

## 2019-10-12 DIAGNOSIS — T8781 Dehiscence of amputation stump: Secondary | ICD-10-CM | POA: Diagnosis not present

## 2019-10-12 DIAGNOSIS — I739 Peripheral vascular disease, unspecified: Secondary | ICD-10-CM | POA: Diagnosis not present

## 2019-10-12 DIAGNOSIS — N183 Chronic kidney disease, stage 3 unspecified: Secondary | ICD-10-CM | POA: Diagnosis not present

## 2019-10-12 DIAGNOSIS — D631 Anemia in chronic kidney disease: Secondary | ICD-10-CM | POA: Diagnosis not present

## 2019-10-12 DIAGNOSIS — E43 Unspecified severe protein-calorie malnutrition: Secondary | ICD-10-CM | POA: Diagnosis not present

## 2019-10-12 DIAGNOSIS — E7800 Pure hypercholesterolemia, unspecified: Secondary | ICD-10-CM | POA: Diagnosis not present

## 2019-10-12 DIAGNOSIS — F0390 Unspecified dementia without behavioral disturbance: Secondary | ICD-10-CM | POA: Diagnosis not present

## 2019-10-12 DIAGNOSIS — I4891 Unspecified atrial fibrillation: Secondary | ICD-10-CM | POA: Diagnosis not present

## 2019-10-17 DIAGNOSIS — E7800 Pure hypercholesterolemia, unspecified: Secondary | ICD-10-CM | POA: Diagnosis not present

## 2019-10-17 DIAGNOSIS — I739 Peripheral vascular disease, unspecified: Secondary | ICD-10-CM | POA: Diagnosis not present

## 2019-10-17 DIAGNOSIS — N183 Chronic kidney disease, stage 3 unspecified: Secondary | ICD-10-CM | POA: Diagnosis not present

## 2019-10-17 DIAGNOSIS — I129 Hypertensive chronic kidney disease with stage 1 through stage 4 chronic kidney disease, or unspecified chronic kidney disease: Secondary | ICD-10-CM | POA: Diagnosis not present

## 2019-10-17 DIAGNOSIS — T8781 Dehiscence of amputation stump: Secondary | ICD-10-CM | POA: Diagnosis not present

## 2019-10-17 DIAGNOSIS — F0390 Unspecified dementia without behavioral disturbance: Secondary | ICD-10-CM | POA: Diagnosis not present

## 2019-10-17 DIAGNOSIS — D631 Anemia in chronic kidney disease: Secondary | ICD-10-CM | POA: Diagnosis not present

## 2019-10-17 DIAGNOSIS — I4891 Unspecified atrial fibrillation: Secondary | ICD-10-CM | POA: Diagnosis not present

## 2019-10-17 DIAGNOSIS — E43 Unspecified severe protein-calorie malnutrition: Secondary | ICD-10-CM | POA: Diagnosis not present

## 2019-10-18 DIAGNOSIS — I739 Peripheral vascular disease, unspecified: Secondary | ICD-10-CM | POA: Diagnosis not present

## 2019-10-18 DIAGNOSIS — E7800 Pure hypercholesterolemia, unspecified: Secondary | ICD-10-CM | POA: Diagnosis not present

## 2019-10-18 DIAGNOSIS — I129 Hypertensive chronic kidney disease with stage 1 through stage 4 chronic kidney disease, or unspecified chronic kidney disease: Secondary | ICD-10-CM | POA: Diagnosis not present

## 2019-10-18 DIAGNOSIS — E43 Unspecified severe protein-calorie malnutrition: Secondary | ICD-10-CM | POA: Diagnosis not present

## 2019-10-18 DIAGNOSIS — T8781 Dehiscence of amputation stump: Secondary | ICD-10-CM | POA: Diagnosis not present

## 2019-10-18 DIAGNOSIS — D631 Anemia in chronic kidney disease: Secondary | ICD-10-CM | POA: Diagnosis not present

## 2019-10-18 DIAGNOSIS — I4891 Unspecified atrial fibrillation: Secondary | ICD-10-CM | POA: Diagnosis not present

## 2019-10-18 DIAGNOSIS — F0390 Unspecified dementia without behavioral disturbance: Secondary | ICD-10-CM | POA: Diagnosis not present

## 2019-10-18 DIAGNOSIS — N183 Chronic kidney disease, stage 3 unspecified: Secondary | ICD-10-CM | POA: Diagnosis not present

## 2019-10-21 DIAGNOSIS — D631 Anemia in chronic kidney disease: Secondary | ICD-10-CM | POA: Diagnosis not present

## 2019-10-21 DIAGNOSIS — E7800 Pure hypercholesterolemia, unspecified: Secondary | ICD-10-CM | POA: Diagnosis not present

## 2019-10-21 DIAGNOSIS — N183 Chronic kidney disease, stage 3 unspecified: Secondary | ICD-10-CM | POA: Diagnosis not present

## 2019-10-21 DIAGNOSIS — I4891 Unspecified atrial fibrillation: Secondary | ICD-10-CM | POA: Diagnosis not present

## 2019-10-21 DIAGNOSIS — I739 Peripheral vascular disease, unspecified: Secondary | ICD-10-CM | POA: Diagnosis not present

## 2019-10-21 DIAGNOSIS — F0390 Unspecified dementia without behavioral disturbance: Secondary | ICD-10-CM | POA: Diagnosis not present

## 2019-10-21 DIAGNOSIS — I129 Hypertensive chronic kidney disease with stage 1 through stage 4 chronic kidney disease, or unspecified chronic kidney disease: Secondary | ICD-10-CM | POA: Diagnosis not present

## 2019-10-21 DIAGNOSIS — E43 Unspecified severe protein-calorie malnutrition: Secondary | ICD-10-CM | POA: Diagnosis not present

## 2019-10-21 DIAGNOSIS — T8781 Dehiscence of amputation stump: Secondary | ICD-10-CM | POA: Diagnosis not present

## 2019-10-24 DIAGNOSIS — F0390 Unspecified dementia without behavioral disturbance: Secondary | ICD-10-CM | POA: Diagnosis not present

## 2019-10-24 DIAGNOSIS — I4891 Unspecified atrial fibrillation: Secondary | ICD-10-CM | POA: Diagnosis not present

## 2019-10-24 DIAGNOSIS — E7800 Pure hypercholesterolemia, unspecified: Secondary | ICD-10-CM | POA: Diagnosis not present

## 2019-10-24 DIAGNOSIS — I739 Peripheral vascular disease, unspecified: Secondary | ICD-10-CM | POA: Diagnosis not present

## 2019-10-24 DIAGNOSIS — E43 Unspecified severe protein-calorie malnutrition: Secondary | ICD-10-CM | POA: Diagnosis not present

## 2019-10-24 DIAGNOSIS — T8781 Dehiscence of amputation stump: Secondary | ICD-10-CM | POA: Diagnosis not present

## 2019-10-24 DIAGNOSIS — I129 Hypertensive chronic kidney disease with stage 1 through stage 4 chronic kidney disease, or unspecified chronic kidney disease: Secondary | ICD-10-CM | POA: Diagnosis not present

## 2019-10-24 DIAGNOSIS — N183 Chronic kidney disease, stage 3 unspecified: Secondary | ICD-10-CM | POA: Diagnosis not present

## 2019-10-24 DIAGNOSIS — D631 Anemia in chronic kidney disease: Secondary | ICD-10-CM | POA: Diagnosis not present

## 2019-10-26 DIAGNOSIS — I739 Peripheral vascular disease, unspecified: Secondary | ICD-10-CM | POA: Diagnosis not present

## 2019-10-26 DIAGNOSIS — E7800 Pure hypercholesterolemia, unspecified: Secondary | ICD-10-CM | POA: Diagnosis not present

## 2019-10-26 DIAGNOSIS — I4891 Unspecified atrial fibrillation: Secondary | ICD-10-CM | POA: Diagnosis not present

## 2019-10-26 DIAGNOSIS — N183 Chronic kidney disease, stage 3 unspecified: Secondary | ICD-10-CM | POA: Diagnosis not present

## 2019-10-26 DIAGNOSIS — E43 Unspecified severe protein-calorie malnutrition: Secondary | ICD-10-CM | POA: Diagnosis not present

## 2019-10-26 DIAGNOSIS — F0390 Unspecified dementia without behavioral disturbance: Secondary | ICD-10-CM | POA: Diagnosis not present

## 2019-10-26 DIAGNOSIS — D631 Anemia in chronic kidney disease: Secondary | ICD-10-CM | POA: Diagnosis not present

## 2019-10-26 DIAGNOSIS — I129 Hypertensive chronic kidney disease with stage 1 through stage 4 chronic kidney disease, or unspecified chronic kidney disease: Secondary | ICD-10-CM | POA: Diagnosis not present

## 2019-10-26 DIAGNOSIS — T8781 Dehiscence of amputation stump: Secondary | ICD-10-CM | POA: Diagnosis not present

## 2019-10-27 DIAGNOSIS — E43 Unspecified severe protein-calorie malnutrition: Secondary | ICD-10-CM | POA: Diagnosis not present

## 2019-10-27 DIAGNOSIS — N183 Chronic kidney disease, stage 3 unspecified: Secondary | ICD-10-CM | POA: Diagnosis not present

## 2019-10-27 DIAGNOSIS — D631 Anemia in chronic kidney disease: Secondary | ICD-10-CM | POA: Diagnosis not present

## 2019-10-27 DIAGNOSIS — I4891 Unspecified atrial fibrillation: Secondary | ICD-10-CM | POA: Diagnosis not present

## 2019-10-27 DIAGNOSIS — I739 Peripheral vascular disease, unspecified: Secondary | ICD-10-CM | POA: Diagnosis not present

## 2019-10-27 DIAGNOSIS — E7800 Pure hypercholesterolemia, unspecified: Secondary | ICD-10-CM | POA: Diagnosis not present

## 2019-10-27 DIAGNOSIS — I129 Hypertensive chronic kidney disease with stage 1 through stage 4 chronic kidney disease, or unspecified chronic kidney disease: Secondary | ICD-10-CM | POA: Diagnosis not present

## 2019-10-27 DIAGNOSIS — T8781 Dehiscence of amputation stump: Secondary | ICD-10-CM | POA: Diagnosis not present

## 2019-10-27 DIAGNOSIS — F0390 Unspecified dementia without behavioral disturbance: Secondary | ICD-10-CM | POA: Diagnosis not present

## 2019-10-28 DIAGNOSIS — N183 Chronic kidney disease, stage 3 unspecified: Secondary | ICD-10-CM | POA: Diagnosis not present

## 2019-10-28 DIAGNOSIS — T8781 Dehiscence of amputation stump: Secondary | ICD-10-CM | POA: Diagnosis not present

## 2019-10-28 DIAGNOSIS — I739 Peripheral vascular disease, unspecified: Secondary | ICD-10-CM | POA: Diagnosis not present

## 2019-10-28 DIAGNOSIS — E43 Unspecified severe protein-calorie malnutrition: Secondary | ICD-10-CM | POA: Diagnosis not present

## 2019-10-28 DIAGNOSIS — I4891 Unspecified atrial fibrillation: Secondary | ICD-10-CM | POA: Diagnosis not present

## 2019-10-28 DIAGNOSIS — D631 Anemia in chronic kidney disease: Secondary | ICD-10-CM | POA: Diagnosis not present

## 2019-10-28 DIAGNOSIS — F0390 Unspecified dementia without behavioral disturbance: Secondary | ICD-10-CM | POA: Diagnosis not present

## 2019-10-28 DIAGNOSIS — E7800 Pure hypercholesterolemia, unspecified: Secondary | ICD-10-CM | POA: Diagnosis not present

## 2019-10-28 DIAGNOSIS — I129 Hypertensive chronic kidney disease with stage 1 through stage 4 chronic kidney disease, or unspecified chronic kidney disease: Secondary | ICD-10-CM | POA: Diagnosis not present

## 2019-10-31 DIAGNOSIS — L97519 Non-pressure chronic ulcer of other part of right foot with unspecified severity: Secondary | ICD-10-CM | POA: Diagnosis not present

## 2019-10-31 DIAGNOSIS — I7025 Atherosclerosis of native arteries of other extremities with ulceration: Secondary | ICD-10-CM | POA: Diagnosis not present

## 2019-10-31 DIAGNOSIS — I129 Hypertensive chronic kidney disease with stage 1 through stage 4 chronic kidney disease, or unspecified chronic kidney disease: Secondary | ICD-10-CM | POA: Diagnosis not present

## 2019-10-31 DIAGNOSIS — L97509 Non-pressure chronic ulcer of other part of unspecified foot with unspecified severity: Secondary | ICD-10-CM | POA: Diagnosis not present

## 2019-10-31 DIAGNOSIS — T8781 Dehiscence of amputation stump: Secondary | ICD-10-CM | POA: Diagnosis not present

## 2019-10-31 DIAGNOSIS — T8130XA Disruption of wound, unspecified, initial encounter: Secondary | ICD-10-CM | POA: Diagnosis not present

## 2019-10-31 DIAGNOSIS — I4891 Unspecified atrial fibrillation: Secondary | ICD-10-CM | POA: Diagnosis not present

## 2019-10-31 DIAGNOSIS — I739 Peripheral vascular disease, unspecified: Secondary | ICD-10-CM | POA: Diagnosis not present

## 2019-10-31 DIAGNOSIS — F0390 Unspecified dementia without behavioral disturbance: Secondary | ICD-10-CM | POA: Diagnosis not present

## 2019-10-31 DIAGNOSIS — E7800 Pure hypercholesterolemia, unspecified: Secondary | ICD-10-CM | POA: Diagnosis not present

## 2019-10-31 DIAGNOSIS — D631 Anemia in chronic kidney disease: Secondary | ICD-10-CM | POA: Diagnosis not present

## 2019-10-31 DIAGNOSIS — E43 Unspecified severe protein-calorie malnutrition: Secondary | ICD-10-CM | POA: Diagnosis not present

## 2019-10-31 DIAGNOSIS — N183 Chronic kidney disease, stage 3 unspecified: Secondary | ICD-10-CM | POA: Diagnosis not present

## 2019-10-31 DIAGNOSIS — G629 Polyneuropathy, unspecified: Secondary | ICD-10-CM | POA: Diagnosis not present

## 2019-11-02 DIAGNOSIS — R918 Other nonspecific abnormal finding of lung field: Secondary | ICD-10-CM | POA: Diagnosis not present

## 2019-11-02 DIAGNOSIS — N179 Acute kidney failure, unspecified: Secondary | ICD-10-CM | POA: Diagnosis not present

## 2019-11-02 DIAGNOSIS — Z9911 Dependence on respirator [ventilator] status: Secondary | ICD-10-CM | POA: Diagnosis not present

## 2019-11-02 DIAGNOSIS — N1330 Unspecified hydronephrosis: Secondary | ICD-10-CM | POA: Diagnosis not present

## 2019-11-02 DIAGNOSIS — E119 Type 2 diabetes mellitus without complications: Secondary | ICD-10-CM | POA: Diagnosis not present

## 2019-11-02 DIAGNOSIS — I480 Paroxysmal atrial fibrillation: Secondary | ICD-10-CM | POA: Diagnosis not present

## 2019-11-02 DIAGNOSIS — R110 Nausea: Secondary | ICD-10-CM | POA: Diagnosis not present

## 2019-11-02 DIAGNOSIS — R933 Abnormal findings on diagnostic imaging of other parts of digestive tract: Secondary | ICD-10-CM | POA: Diagnosis not present

## 2019-11-02 DIAGNOSIS — A419 Sepsis, unspecified organism: Secondary | ICD-10-CM | POA: Diagnosis not present

## 2019-11-02 DIAGNOSIS — R633 Feeding difficulties: Secondary | ICD-10-CM | POA: Diagnosis not present

## 2019-11-02 DIAGNOSIS — R1310 Dysphagia, unspecified: Secondary | ICD-10-CM | POA: Diagnosis not present

## 2019-11-02 DIAGNOSIS — K55049 Acute infarction of large intestine, extent unspecified: Secondary | ICD-10-CM | POA: Diagnosis not present

## 2019-11-02 DIAGNOSIS — R531 Weakness: Secondary | ICD-10-CM | POA: Diagnosis not present

## 2019-11-02 DIAGNOSIS — R451 Restlessness and agitation: Secondary | ICD-10-CM | POA: Diagnosis not present

## 2019-11-02 DIAGNOSIS — J690 Pneumonitis due to inhalation of food and vomit: Secondary | ICD-10-CM | POA: Diagnosis not present

## 2019-11-02 DIAGNOSIS — K56699 Other intestinal obstruction unspecified as to partial versus complete obstruction: Secondary | ICD-10-CM | POA: Diagnosis not present

## 2019-11-02 DIAGNOSIS — R0902 Hypoxemia: Secondary | ICD-10-CM | POA: Diagnosis not present

## 2019-11-02 DIAGNOSIS — K55042 Diffuse acute infarction of large intestine: Secondary | ICD-10-CM | POA: Diagnosis not present

## 2019-11-02 DIAGNOSIS — R0689 Other abnormalities of breathing: Secondary | ICD-10-CM | POA: Diagnosis not present

## 2019-11-02 DIAGNOSIS — K562 Volvulus: Secondary | ICD-10-CM | POA: Diagnosis not present

## 2019-11-02 DIAGNOSIS — I482 Chronic atrial fibrillation, unspecified: Secondary | ICD-10-CM | POA: Diagnosis not present

## 2019-11-02 DIAGNOSIS — R9389 Abnormal findings on diagnostic imaging of other specified body structures: Secondary | ICD-10-CM | POA: Diagnosis not present

## 2019-11-02 DIAGNOSIS — J90 Pleural effusion, not elsewhere classified: Secondary | ICD-10-CM | POA: Diagnosis not present

## 2019-11-02 DIAGNOSIS — I340 Nonrheumatic mitral (valve) insufficiency: Secondary | ICD-10-CM | POA: Diagnosis not present

## 2019-11-02 DIAGNOSIS — Z20822 Contact with and (suspected) exposure to covid-19: Secondary | ICD-10-CM | POA: Diagnosis not present

## 2019-11-02 DIAGNOSIS — R140 Abdominal distension (gaseous): Secondary | ICD-10-CM | POA: Diagnosis not present

## 2019-11-02 DIAGNOSIS — Z66 Do not resuscitate: Secondary | ICD-10-CM | POA: Diagnosis not present

## 2019-11-02 DIAGNOSIS — K4090 Unilateral inguinal hernia, without obstruction or gangrene, not specified as recurrent: Secondary | ICD-10-CM | POA: Diagnosis not present

## 2019-11-02 DIAGNOSIS — F0391 Unspecified dementia with behavioral disturbance: Secondary | ICD-10-CM | POA: Diagnosis not present

## 2019-11-02 DIAGNOSIS — K56609 Unspecified intestinal obstruction, unspecified as to partial versus complete obstruction: Secondary | ICD-10-CM | POA: Diagnosis not present

## 2019-11-02 DIAGNOSIS — R4182 Altered mental status, unspecified: Secondary | ICD-10-CM | POA: Diagnosis not present

## 2019-11-02 DIAGNOSIS — R197 Diarrhea, unspecified: Secondary | ICD-10-CM | POA: Diagnosis not present

## 2019-11-02 DIAGNOSIS — M6281 Muscle weakness (generalized): Secondary | ICD-10-CM | POA: Diagnosis not present

## 2019-11-02 DIAGNOSIS — Z9981 Dependence on supplemental oxygen: Secondary | ICD-10-CM | POA: Diagnosis not present

## 2019-11-02 DIAGNOSIS — I361 Nonrheumatic tricuspid (valve) insufficiency: Secondary | ICD-10-CM | POA: Diagnosis not present

## 2019-11-02 DIAGNOSIS — Z4682 Encounter for fitting and adjustment of non-vascular catheter: Secondary | ICD-10-CM | POA: Diagnosis not present

## 2019-11-02 DIAGNOSIS — I272 Pulmonary hypertension, unspecified: Secondary | ICD-10-CM | POA: Diagnosis not present

## 2019-11-02 DIAGNOSIS — N189 Chronic kidney disease, unspecified: Secondary | ICD-10-CM | POA: Diagnosis not present

## 2019-11-02 DIAGNOSIS — J9691 Respiratory failure, unspecified with hypoxia: Secondary | ICD-10-CM | POA: Diagnosis not present

## 2019-11-02 DIAGNOSIS — Z515 Encounter for palliative care: Secondary | ICD-10-CM | POA: Diagnosis not present

## 2019-11-02 DIAGNOSIS — R6521 Severe sepsis with septic shock: Secondary | ICD-10-CM | POA: Diagnosis not present

## 2019-11-02 DIAGNOSIS — I959 Hypotension, unspecified: Secondary | ICD-10-CM | POA: Diagnosis not present

## 2019-11-02 DIAGNOSIS — J9811 Atelectasis: Secondary | ICD-10-CM | POA: Diagnosis not present

## 2019-11-02 DIAGNOSIS — Z452 Encounter for adjustment and management of vascular access device: Secondary | ICD-10-CM | POA: Diagnosis not present

## 2019-12-02 DEATH — deceased
